# Patient Record
Sex: Female | Born: 1950 | Race: White | Hispanic: No | Marital: Married | Smoking: Never smoker
Health system: Southern US, Community
[De-identification: ages and names within clinical notes are randomized; demographics above are authoritative.]

## PROBLEM LIST (undated history)

## (undated) DIAGNOSIS — K219 Gastro-esophageal reflux disease without esophagitis: Secondary | ICD-10-CM

## (undated) DIAGNOSIS — Z860100 Personal history of colon polyps, unspecified: Secondary | ICD-10-CM

## (undated) DIAGNOSIS — H409 Unspecified glaucoma: Secondary | ICD-10-CM

## (undated) DIAGNOSIS — Z9889 Other specified postprocedural states: Secondary | ICD-10-CM

## (undated) DIAGNOSIS — T8859XA Other complications of anesthesia, initial encounter: Secondary | ICD-10-CM

## (undated) DIAGNOSIS — E079 Disorder of thyroid, unspecified: Secondary | ICD-10-CM

## (undated) DIAGNOSIS — T7840XA Allergy, unspecified, initial encounter: Secondary | ICD-10-CM

## (undated) DIAGNOSIS — K449 Diaphragmatic hernia without obstruction or gangrene: Secondary | ICD-10-CM

## (undated) DIAGNOSIS — Z5189 Encounter for other specified aftercare: Secondary | ICD-10-CM

## (undated) DIAGNOSIS — D509 Iron deficiency anemia, unspecified: Secondary | ICD-10-CM

## (undated) DIAGNOSIS — D649 Anemia, unspecified: Secondary | ICD-10-CM

## (undated) DIAGNOSIS — E0500 Thyrotoxicosis with diffuse goiter without thyrotoxic crisis or storm: Secondary | ICD-10-CM

## (undated) HISTORY — PX: COLONOSCOPY: SHX174

## (undated) HISTORY — DX: Allergy, unspecified, initial encounter: T78.40XA

## (undated) HISTORY — DX: Gastro-esophageal reflux disease without esophagitis: K21.9

## (undated) HISTORY — DX: Anemia, unspecified: D64.9

## (undated) HISTORY — DX: Encounter for other specified aftercare: Z51.89

## (undated) HISTORY — PX: THYROIDECTOMY: SHX17

## (undated) HISTORY — PX: BACK SURGERY: SHX140

## (undated) HISTORY — DX: Personal history of colon polyps, unspecified: Z86.0100

## (undated) HISTORY — DX: Iron deficiency anemia, unspecified: D50.9

## (undated) HISTORY — DX: Thyrotoxicosis with diffuse goiter without thyrotoxic crisis or storm: E05.00

## (undated) HISTORY — DX: Personal history of colonic polyps: Z86.010

## (undated) HISTORY — PX: ABDOMINAL HYSTERECTOMY: SHX81

## (undated) HISTORY — PX: BREAST EXCISIONAL BIOPSY: SUR124

## (undated) HISTORY — PX: POLYPECTOMY: SHX149

## (undated) HISTORY — DX: Diaphragmatic hernia without obstruction or gangrene: K44.9

## (undated) HISTORY — DX: Unspecified glaucoma: H40.9

---

## 1998-07-09 ENCOUNTER — Ambulatory Visit (HOSPITAL_COMMUNITY): Admission: RE | Admit: 1998-07-09 | Discharge: 1998-07-09 | Payer: Self-pay | Admitting: Endocrinology

## 2004-12-06 ENCOUNTER — Ambulatory Visit: Payer: Self-pay | Admitting: Gastroenterology

## 2005-01-17 ENCOUNTER — Ambulatory Visit: Payer: Self-pay | Admitting: Gastroenterology

## 2006-10-22 ENCOUNTER — Ambulatory Visit (HOSPITAL_COMMUNITY): Admission: RE | Admit: 2006-10-22 | Discharge: 2006-10-23 | Payer: Self-pay | Admitting: Neurosurgery

## 2008-12-29 HISTORY — PX: OTHER SURGICAL HISTORY: SHX169

## 2009-10-01 ENCOUNTER — Encounter: Admission: RE | Admit: 2009-10-01 | Discharge: 2009-10-01 | Payer: Self-pay | Admitting: Orthopedic Surgery

## 2009-12-31 ENCOUNTER — Encounter (INDEPENDENT_AMBULATORY_CARE_PROVIDER_SITE_OTHER): Payer: Self-pay | Admitting: *Deleted

## 2010-04-05 ENCOUNTER — Telehealth: Payer: Self-pay | Admitting: Gastroenterology

## 2010-04-25 ENCOUNTER — Telehealth: Payer: Self-pay | Admitting: Gastroenterology

## 2010-04-29 ENCOUNTER — Ambulatory Visit: Payer: Self-pay | Admitting: Internal Medicine

## 2010-04-29 ENCOUNTER — Encounter: Payer: Self-pay | Admitting: Physician Assistant

## 2010-04-29 DIAGNOSIS — R059 Cough, unspecified: Secondary | ICD-10-CM | POA: Insufficient documentation

## 2010-04-29 DIAGNOSIS — K5730 Diverticulosis of large intestine without perforation or abscess without bleeding: Secondary | ICD-10-CM | POA: Insufficient documentation

## 2010-04-29 DIAGNOSIS — R6881 Early satiety: Secondary | ICD-10-CM

## 2010-04-29 DIAGNOSIS — R05 Cough: Secondary | ICD-10-CM | POA: Insufficient documentation

## 2010-04-29 DIAGNOSIS — Z860100 Personal history of colon polyps, unspecified: Secondary | ICD-10-CM | POA: Insufficient documentation

## 2010-04-29 DIAGNOSIS — E669 Obesity, unspecified: Secondary | ICD-10-CM

## 2010-04-29 DIAGNOSIS — K219 Gastro-esophageal reflux disease without esophagitis: Secondary | ICD-10-CM

## 2010-05-01 ENCOUNTER — Ambulatory Visit: Payer: Self-pay | Admitting: Gastroenterology

## 2010-05-02 ENCOUNTER — Encounter: Payer: Self-pay | Admitting: Gastroenterology

## 2010-05-06 ENCOUNTER — Encounter: Payer: Self-pay | Admitting: Gastroenterology

## 2010-05-09 ENCOUNTER — Ambulatory Visit (HOSPITAL_COMMUNITY): Admission: RE | Admit: 2010-05-09 | Discharge: 2010-05-09 | Payer: Self-pay | Admitting: Internal Medicine

## 2010-06-20 ENCOUNTER — Ambulatory Visit: Payer: Self-pay | Admitting: Gastroenterology

## 2010-06-20 DIAGNOSIS — K3184 Gastroparesis: Secondary | ICD-10-CM

## 2010-08-09 ENCOUNTER — Encounter (INDEPENDENT_AMBULATORY_CARE_PROVIDER_SITE_OTHER): Payer: Self-pay | Admitting: *Deleted

## 2010-10-02 ENCOUNTER — Telehealth: Payer: Self-pay | Admitting: Gastroenterology

## 2010-10-03 ENCOUNTER — Encounter (INDEPENDENT_AMBULATORY_CARE_PROVIDER_SITE_OTHER): Payer: Self-pay | Admitting: *Deleted

## 2010-10-07 ENCOUNTER — Telehealth: Payer: Self-pay | Admitting: Gastroenterology

## 2010-10-10 ENCOUNTER — Encounter (INDEPENDENT_AMBULATORY_CARE_PROVIDER_SITE_OTHER): Payer: Self-pay | Admitting: *Deleted

## 2010-10-14 ENCOUNTER — Ambulatory Visit: Payer: Self-pay | Admitting: Gastroenterology

## 2010-10-21 ENCOUNTER — Telehealth: Payer: Self-pay | Admitting: Gastroenterology

## 2010-10-24 ENCOUNTER — Encounter: Admission: RE | Admit: 2010-10-24 | Discharge: 2010-10-24 | Payer: Self-pay | Admitting: Family Medicine

## 2010-11-01 ENCOUNTER — Ambulatory Visit: Payer: Self-pay | Admitting: Gastroenterology

## 2010-11-05 ENCOUNTER — Encounter: Payer: Self-pay | Admitting: Gastroenterology

## 2011-01-28 NOTE — Assessment & Plan Note (Signed)
Summary: follow up EGD/sheri   History of Present Illness Visit Type: Follow-up Visit Primary GI MD: Elie Goody MD [facility] Primary Provider: Selinda Flavin, MD Chief Complaint: GERD, gastroparesis.  Pt states she is doing well on Ranitidine.  She will need 90-day RX. History of Present Illness:   Shannon Figueroa returns for followup today of GERD and mild gastroparesis. Gastric emptying scan revealed 38% retention at 2 hours. EGD revealed distal esophageal erythema with biopsies consistent with GERD. Since adding ranitidine 300 mg h.s., bedblocks, dietary recommendations for gastroparesis and stricter antireflux measures, her symptoms have come under complete control.   GI Review of Systems      Denies abdominal pain, acid reflux, belching, bloating, chest pain, dysphagia with liquids, dysphagia with solids, heartburn, loss of appetite, nausea, vomiting, vomiting blood, weight loss, and  weight gain.        Denies anal fissure, black tarry stools, change in bowel habit, constipation, diarrhea, diverticulosis, fecal incontinence, heme positive stool, hemorrhoids, irritable bowel syndrome, jaundice, light color stool, liver problems, rectal bleeding, and  rectal pain.   Current Medications (verified): 1)  Aciphex 20 Mg Tbec (Rabeprazole Sodium) .... Take 1 Tablet By Mouth Once A Day 2)  Alprazolam 0.25 Mg Tabs (Alprazolam) .... Take 1 Tab By Mouth At Bedtime 3)  Synthroid 100 Mcg Tabs (Levothyroxine Sodium) .... Take 1 Tablet By Mouth Once A Day 4)  Flonase 50 Mcg/act Susp (Fluticasone Propionate) .Marland Kitchen.. 1 Spray Once Daily 5)  Ranitidine Hcl 300 Mg  Tabs (Ranitidine Hcl) .... One At Bedtime  Allergies (verified): 1)  ! Codeine 2)  ! Pcn  Past History:  Past Medical History: Reviewed history from 06/18/2010 and no changes required. 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 Back surgery Thyroidectomy Hysterectomy-partial BILATERAL OOPHORECTOMY 10/10-BENIGN MASS.  Family History: Reviewed history from 04/29/2010 and no changes required. No FH of Colon Cancer: Family History of Colon Polyps: Mother, Father Family History of Diabetes: Father Family History of Heart Disease: Father  Social History: Reviewed history from 04/29/2010 and no changes required. Patient has never smoked.  Alcohol Use - no Illicit Drug Use - no Patient does not get regular exercise.   Review of Systems       The pertinent positives and negatives are noted as above and in the HPI. All other ROS were reviewed and were negative.   Vital Signs:  Patient profile:   60 year old female Height:      70 inches Weight:      216 pounds BMI:     31.10 Pulse rate:   68 / minute Pulse rhythm:   regular BP sitting:   110 / 68  (left arm) Cuff size:   regular  Vitals Entered By: Francee Piccolo CMA Duncan Dull) (June 20, 2010 2:29 PM)  Physical Exam  General:  Well developed, well nourished, no acute distress. Head:  Normocephalic and atraumatic. Eyes:  PERRLA, no icterus. Mouth:  No deformity or lesions, dentition normal. Lungs:  Clear throughout to auscultation. Heart:  Regular rate and rhythm; no murmurs, rubs,  or bruits. Abdomen:  Soft, nontender and nondistended. No masses, hepatosplenomegaly or hernias noted. Normal bowel sounds. Psych:  Alert and cooperative. Normal mood and affect.  Impression & Recommendations:  Problem # 1:  GERD (ICD-530.81) Nonerosive esophagitis with mild gastroparesis. Continue standard dietary and lifestyle measures. Continue AcipHex 20 mg  q.a.m. and ranitidine 300 mg q.h.s.  Problem # 2:  GASTROPARESIS (ICD-536.3) Standard dietary measures and treatment for GERD as above.  Problem # 3:  PERSONAL HX COLONIC POLYPS (ICD-V12.72) History of adenomatous colon polyps in  January 2006. She is due for a five-year surveillance. Will plan to proceed within the next few months. Will allow some time for her UGI symptoms to be fully controlled before proceeding.  Patient Instructions: 1)  Please continue current medications.  2)  Your prescriptions have been sent into to your pharmacy. 3)  Please schedule a follow-up appointment in 1 year. 4)  Copy sent to : Selinda Flavin, MD 5)  The medication list was reviewed and reconciled.  All changed / newly prescribed medications were explained.  A complete medication list was provided to the patient / caregiver.  Prescriptions: RANITIDINE HCL 300 MG  TABS (RANITIDINE HCL) one at bedtime  #90 x 3   Entered by:   Christie Nottingham CMA (AAMA)   Authorized by:   Meryl Dare MD [facility]   Signed by:   Meryl Dare MD [facility] on 06/20/2010   Method used:   Electronically to        [facility] (retail)       [address]       [place], [place]  [address]       Ph: [phone]       Fax: [phone]   RxID:   [id number] ACIPHEX 20 MG TBEC (RABEPRAZOLE SODIUM) Take 1 tablet by mouth once a day  #90 x 3   Entered by:   Christie Nottingham CMA (AAMA)   Authorized by:   Meryl Dare MD [facility]   Signed by:   Meryl Dare MD [facility] on 06/20/2010   Method used:   Electronically to        [facility]* (retail)             ,          Ph: [phone]       Fax: [phone]   RxID:   [id number]   Appended Document: follow up EGD/sheri    Clinical Lists Changes  Observations: Added new observation of COLONNXTDUE: 08/29/2010 (06/20/2010 15:15)

## 2011-01-28 NOTE — Procedures (Signed)
Summary: Upper Endoscopy  Patient: [name] Note: All result statuses are Final unless otherwise noted.  Tests: (1) Upper Endoscopy (EGD)   EGD Upper Endoscopy       DONE     [facility]     [address]     [place], [place]  [address]           ENDOSCOPY PROCEDURE REPORT           PATIENT:  Shannon Figueroa, Shannon Figueroa  MR#:  [id number]     BIRTHDATE:  08/01/1951, 59 yrs. old  GENDER:  female           ENDOSCOPIST:  Judie Petit T. Russella Dar, MD, [facility]           PROCEDURE DATE:  05/01/2010     PROCEDURE:  EGD with biopsy     ASA CLASS:  Class II     INDICATIONS:  GERD           MEDICATIONS:  Fentanyl 50 mcg IV, Versed 5 mg IV     TOPICAL ANESTHETIC:  Exactacain Spray           DESCRIPTION OF PROCEDURE:   After the risks benefits and     alternatives of the procedure were thoroughly explained, informed     consent was obtained.  The LB GIF-H180 [id number] endoscope was     introduced through the mouth and advanced to the second portion of     the duodenum, without limitations.  The instrument was slowly     withdrawn as the mucosa was fully examined.     <<PROCEDUREIMAGES>>           Esophagitis was found in the distal esophagus. It was erythematous     and granular. It was 1 cm in length. R/O Barretts.  Multiple     biopsies were obtained and sent to pathology.  The stomach was     entered and closely examined. The pylorus, antrum, angularis, and     lesser curvature were well visualized, including a retroflexed     view of the cardia and fundus. The stomach wall was normally     distensable. The scope passed easily through the pylorus into the     duodenum.  The duodenal bulb was normal in appearance, as was the     postbulbar duodenum.  Otherwise the examination was normal.     Retroflexed views revealed a hiatal hernia, 6 cm. (32-38cm)  The     scope was then withdrawn from the patient and the procedure     completed.           COMPLICATIONS:  None           ENDOSCOPIC  IMPRESSION:     1) Esophagitis     2) Large hiatal hernia           RECOMMENDATIONS:     1) Anti-reflux regimen and 4" bed blocks     2) Await pathology results     3) OP follow-up in 6 weeks     4) Aciphex 20mg  po qam     5) ranitidine 300mg  hs, #30, 11 refills           Malcolm T. Russella Dar, MD, [facility]           CC:  Selinda Flavin, MD           n.     Rosalie DoctorVenita Lick. Stark at 05/01/2010 04:00 PM  [place], [place], [id number]  Note: An exclamation mark (!) indicates a result that was not dispersed into the flowsheet. Document Creation Date: 05/01/2010 4:01 PM _______________________________________________________________________  (1) Order result status: Final Collection or observation date-time: 05/01/2010 15:53 Requested date-time:  Receipt date-time:  Reported date-time:  Referring Physician:   Ordering Physician: Claudette Head [phone]) Specimen Source:  Source: Launa Grill Order Number: [id number] Lab site:

## 2011-01-28 NOTE — Progress Notes (Signed)
Summary: reflux  Phone Note Call from Patient Call back at [phone]   Caller: Patient Call For: Dr. Russella Dar Reason for Call: Talk to Nurse Summary of Call: Aciphex and Ranitidine are not helping anymore with reflux Initial call taken by: Vallarie Mare,  October 02, 2010 1:41 PM  Follow-up for Phone Call        Pt called to report increase in reflux for the last 6 weeks. Pt states she has "attacks at night that makes me get up." Pt is taking Aciphex daily. She has been taking the Ranitidine 150 mg at dinner and 300 mg at hs. Pt states this has not helped her. States she is abiding by anti-reflux diet and is using bed blocks. Dr. Russella Dar , please advise. Follow-up by: Jesse Fall RN,  October 02, 2010 2:38 PM  Additional Follow-up for Phone Call Additional follow up Details #1::        Change to Aciphex 20mg  by mouth two times a day (before breakfast and dinner) and ranitidine 300mg  hs. Gastroparesis and GERD diets. Additional Follow-up by: Meryl Dare MD [facility],  October 02, 2010 3:28 PM    Additional Follow-up for Phone Call Additional follow up Details #2::    Patient  advised of Dr. Ardell Isaacs recommendation. New rx to pharmacy. Follow-up by: Darcey Nora RN, CGRN,  October 03, 2010 10:42 AM  New/Updated Medications: ACIPHEX 20 MG TBEC (RABEPRAZOLE SODIUM) Take 1 tablet before breakfast and 1 tablet before dinner Prescriptions: ACIPHEX 20 MG TBEC (RABEPRAZOLE SODIUM) Take 1 tablet before breakfast and 1 tablet before dinner  #60 x 11   Entered by:   Darcey Nora RN, CGRN   Authorized by:   Meryl Dare MD [facility]   Signed by:   Darcey Nora RN, CGRN on 10/03/2010   Method used:   [facility]ron[facility]        [facility] [address] [address] [id number] (retail[facility]       [address]       [place], [place]  [address]       Ph: [phone] or [phone]       Fax: [phone]   RxID:   [id number]

## 2011-01-28 NOTE — Progress Notes (Signed)
Summary: ?'s re Dexilant  Phone Note Call from Patient Call back at [phone]   Caller: Patient Call For: Dr. Russella Dar Reason for Call: Talk to Nurse Summary of Call: questions about Dexilant Initial call taken by: Vallarie Mare,  October 07, 2010 1:35 PM  Follow-up for Phone Call        Pt states her PCP already put her on Dexilant and that gave her diarrhea. Pt states she has tried Aciphex, Dexilant, Prilosec, and Nexium which all gave her some form of side effect that she could not tolerate. Pt would like to try something else. Told pt we will switch her to Protonix. Is that ok? Follow-up by: Christie Nottingham CMA Duncan Dull),  October 07, 2010 2:15 PM  Additional Follow-up for Phone Call Additional follow up Details #1::        Protonix 40mg  by mouth two times a day with refills for one year. Additional Follow-up by: Meryl Dare MD [facility],  October 07, 2010 2:35 PM   New Allergies: ! NEXIUM ! DEXILANT ! * ACIPHEX Additional Follow-up for Phone Call Additional follow up Details #2::    Rx was sent to pts pharmacy.  Follow-up by: Christie Nottingham CMA Duncan Dull),  October 07, 2010 2:40 PM  New/Updated Medications: PANTOPRAZOLE SODIUM 40 MG TBEC (PANTOPRAZOLE SODIUM) one tablet by mouth two times a day New Allergies: ! NEXIUM ! DEXILANT ! * ACIPHEXPrescriptions: PANTOPRAZOLE SODIUM 40 MG TBEC (PANTOPRAZOLE SODIUM) one tablet by mouth two times a day  #60 x 11   Entered by:   Christie Nottingham CMA (AAMA)   Authorized by:   Meryl Dare MD [facility]   Signed by:   Christie Nottingham CMA (AAMA) on 10/07/2010   Method used:   [facility]        [facility]  [address] [id number]       [address]       [place], [place]  [address]       Ph: [phone] or [phone]       Fax: [phone]   RxID:   [id number]

## 2011-01-28 NOTE — Letter (Signed)
Summary: Appt Reminder 2  [facility]  [address] [place], [place] [address]   Phone: [phone]  Fax: [phone]        May 02, 2010 MRN: [id number]    [facility] [address] [place], [place]  [address]    Dear Ms. Rizzolo,   You have a return appointment with Dr. Russella Dar on 06-20-10 at 2:30 pm.  I have canceled the appointment that you had scheduled with Dr Russella Dar on 05-20-10.  Please remember to bring a complete list of the medicines you are taking, your insurance card and your co-pay.  If you have to cancel or reschedule this appointment, please call before 5:00 pm the evening before to avoid a cancellation fee.  If you have any questions or concerns, please call [phone].    Sincerely,    Darcey Nora RN, CGRN  Appended Document: Appt Reminder 2 letter mailed to patient's home

## 2011-01-28 NOTE — Letter (Signed)
Summary: Patient [facility] Biopsy Results  [facility]  [address] [place], [place] [address]   Phone: [phone]  Fax: [phone]        May 06, 2010 MRN: [id number]    [facility] [address] [place], [place]  [address]    Dear Ms. Humann,  I am pleased to inform you that the biopsies taken during your recent endoscopic examination did not show any evidence of cancer upon pathologic examination. The biopsies showed changes of acid reflux.  Continue with the treatment plan as outlined on the day of your      exam.  Please call us if you are having persistent problems or have questions about your condition that have not been fully answered at this time.  Sincerely,  Meryl Dare MD [facility]  This letter has been electronically signed by your physician.  Appended Document: Patient Notice-Endo Biopsy Results letter mailed

## 2011-01-28 NOTE — Miscellaneous (Signed)
Summary: [facility]  Clinical Lists Changes  Medications: Added new medication of MOVIPREP 100 GM  SOLR (PEG-KCL-NACL-NASULF-NA ASC-C) As per prep instructions. - Signed Rx of MOVIPREP 100 GM  SOLR (PEG-KCL-NACL-NASULF-NA ASC-C) As per prep instructions.;  #1 x 0;  Signed;  Entered by: Ezra Sites RN;  Authorized by: Meryl Dare MD [facility];  Method used: [facility]  [address] [facility] [address], [place], [place], [place]  [address], Ph: [phone] or [phone], Fax: [phone] Allergies: Changed allergy or adverse reaction from NEXIUM to NEXIUM    Prescriptions: MOVIPREP 100 GM  SOLR (PEG-KCL-NACL-NASULF-NA ASC-C) As per prep instructions.  #1 x 0   Entered by:   Ezra Sites RN   Authorized by:   Meryl Dare MD [facility]   Signed by:   Ezra Sites RN on 10/14/2010   Method used:   [facility]        [facility]  [address] [facility]       [address]       [place], [place]  [address]       Ph: [phone] or [phone]       Fax: [phone]   RxID:   [id number]

## 2011-01-28 NOTE — Progress Notes (Signed)
Summary: rx change  Phone Note Call from Patient Call back at [phone]   Caller: Patient Call For: Dr. Russella Dar Reason for Call: Talk to Nurse Summary of Call: needs to change Pantoprazole rx to mail order, [facility] 90 day supply Initial call taken by: Vallarie Mare,  October 21, 2010 9:37 AM  Follow-up for Phone Call        Prescription has been sent to [facility] and pt notified. Follow-up by: Christie Nottingham CMA Duncan Dull),  October 21, 2010 9:39 AM    Prescriptions: PANTOPRAZOLE SODIUM 40 MG TBEC (PANTOPRAZOLE SODIUM) one tablet by mouth two times a day  #180 x 1   Entered by:   Christie Nottingham CMA (AAMA)   Authorized by:   Meryl Dare MD [facility]   Signed by:   Christie Nottingham CMA (AAMA) on 10/21/2010   Method used:   Electronically to        [facility]* (retail)             ,          Ph: [phone]       Fax: [phone]   RxID:   [id number]

## 2011-01-28 NOTE — Assessment & Plan Note (Signed)
Summary: GERD, vomiting/cough/sheri   History of Present Illness Visit Type: Initial Visit Primary GI MD: Elie Goody MD [facility] Primary Provider: Selinda Flavin, MD Chief Complaint: Patient c/o several months worsening problems with her reflux. She c/o midsternal chest burning as well as early satiety. She c/o feeling like something is stuck lower down in her throat, but denies dysphagia or odynophagia. Patient also c/o vomiting. She has occasional diarrhea as well but denies any abdominal pain associated with that. History of Present Illness:   60 Y.O FEMALE KNOWN TO DR. Russella Dar FROM COLONOSCOPY DONE IN 1/06 SHE HAD DIVERTICULOSIS AND AN ADENOMATOUS POLYP. SHE IS DUE FOR FOLLOW UP.SHE COMES IN TODAY WITH C/O POORLY CONTROLLED REFLUX SXS. SHE HAS BEEN ON MEDICATION FOR THE PAST 12 YEARS BUT HAS NOT HAD AN EGD. SHE HAS DIFFICULTY WITH PPI'S AS SEVERAL HAVE SIDE EFFECT OF DIARRHEA. SHE IS CURRENTLY ON ACIPHEX 20 MG, WHICH HELPS BUT DOES NOT CONTROL HER SXS. SHE CANNOT TOLERATE 2 DAILY DUE TO DIARRHEA. SHE HAS DAYTIME SXS,AND BAD NIGHTIME SXS-OFTEN SLEEPING IN A RECLINER. SHE WILL WAKE UP REFLUXING WITH BITTER FLUID IN HER MOUTH AND SOMETIMES FEELS LIKE SHE IS ASPIRATING.NO DYSPHAGIA OR ODYNOPHAGIA. SHE ALSO C/O EARLY SATIETY-FEELING VERY FULL AND UNCOMFORTABLE HOURSAFTER SHE HAS EATEN.NO SIGNIFICANT WEIGHT LOSS. PERSISTENT COUGH.   GI Review of Systems    Reports acid reflux, belching, and  bloating.      Denies abdominal pain, chest pain, dysphagia with liquids, dysphagia with solids, heartburn, loss of appetite, nausea, vomiting, vomiting blood, and  weight loss.        Denies anal fissure, black tarry stools, change in bowel habit, constipation, diarrhea, diverticulosis, fecal incontinence, heme positive stool, hemorrhoids, irritable bowel syndrome, jaundice, light color stool, liver problems, rectal bleeding, and  rectal pain. Preventive Screening-Counseling & Management  Alcohol-Tobacco  Smoking Status: never  Caffeine-Diet-Exercise     Does Patient Exercise: no      Drug Use:  no.      Current Medications (verified): 1)  Aciphex 20 Mg Tbec (Rabeprazole Sodium) .... Take 1 Tablet By Mouth Once A Day 2)  Alprazolam 0.25 Mg Tabs (Alprazolam) .... Take 1 Tab By Mouth At Bedtime 3)  Synthroid 100 Mcg Tabs (Levothyroxine Sodium) .... Take 1 Tablet By Mouth Once A Day 4)  Flonase 50 Mcg/act Susp (Fluticasone Propionate) .Marland Kitchen.. 1 Spray Once Daily  Allergies (verified): 1)  ! Codeine 2)  ! Pcn  Past History:  Past Medical History: GERD Anxiety Hx Graves Disease OSTEOARTHRITIS  Past Surgical History: Back surgery Thyroidectomy Hysterectomy-partial BILATERAL OOPHORECTOMY 10/10-BENIGN MASS.  Family History: No FH of Colon Cancer: Family History of Colon Polyps: Mother, Father Family History of Diabetes: Father Family History of Heart Disease: Father  Social History: Patient has never smoked.  Alcohol Use - no Illicit Drug Use - no Patient does not get regular exercise.  Smoking Status:  never Drug Use:  no Does Patient Exercise:  no  Review of Systems  The patient denies allergy/sinus, anemia, anxiety-new, arthritis/joint pain, back pain, blood in urine, breast changes/lumps, change in vision, confusion, cough, coughing up blood, depression-new, fainting, fatigue, fever, headaches-new, hearing problems, heart murmur, heart rhythm changes, itching, menstrual pain, muscle pains/cramps, night sweats, nosebleeds, pregnancy symptoms, shortness of breath, skin rash, sleeping problems, sore throat, swelling of feet/legs, swollen lymph glands, thirst - excessive , urination - excessive , urination changes/pain, urine leakage, vision changes, and voice change.         ROS OTHERWISE AS IN  HPI  Vital Signs:  Patient profile:   60 year old female Height:      70 inches Weight:      212.13 pounds BMI:     30.55 BSA:     2.14 Pulse rate:   80 / minute Pulse rhythm:    regular BP sitting:   130 / 74  (right arm)  Vitals Entered By: Lamona Curl CMA Duncan Dull) (Apr 29, 2010 9:12 AM)  Physical Exam  General:  Well developed, well nourished, no acute distress. Head:  Normocephalic and atraumatic. Eyes:  PERRLA, no icterus. Lungs:  Clear throughout to auscultation. Heart:  Regular rate and rhythm; no murmurs, rubs,  or bruits. Abdomen:  SOFT, NONTENDER, NO MASS OR HSM,BS+ Rectal:  NOT DONE Neurologic:  Alert and  oriented x4;  grossly normal neurologically. Psych:  Alert and cooperative. Normal mood and affect.   Impression & Recommendations:  Problem # 1:  GERD (ICD-530.81) Assessment Deteriorated 59 Y.O FEMALE WITH POORLY CONTROLLED ACID REFLUX-WITH DAY AND NIGHTTIME SXS,SOUR BRASH ? ASPIRATION.ALSO WITH EARLY SATIETY -R/O UNDERLYING GASTROPARESIS,  CONTINUE ACIPHEX 20 MG DAILY AT DINNER ADD PRILOSEC 20 MG DAILY IN AM BEFORE BREAKFAST STRICT ANTIREFUX REGIMEN REVIEWED INCLUDING ELEVATION OF HEAD OF BED. SCHEDULE FOR EGD WITH DR. Jalene Mullet DISCUSSED IN DETAIL WITH PT. SCHEDULE FOR GASTRIC EMPTYING SCAN.  Orders: EGD (EGD)  Problem # 2:  PERSONAL HX COLONIC POLYPS (ICD-V12.72) Assessment: Comment Only ADENOMATOUS- LAST COLON 1/06. SHE IS DUE FOR FOLLOW UP . WILL PURSUE ONCE HER REFLUX ISSUES BETTER CONTROLLED.  Problem # 3:  OBESITY (ICD-278.00) Assessment: Comment Only  Other Orders: Gastric Emptying Scan (GES)  Patient Instructions: 1)  Please come for your scheduled endoscopy on 05/01/10 at [facility] 4th Floor. You will need to arrive at 2:30 pm for your 3:30 pm procedure. 2)  Please continue taking your Aciphex (before dinner) every day. 3)  Add prilosec 20 mg daily (before breakfast). You have been given samples of this. 4)  You have been scheduled for a gastric emptying scan on 05/09/10 at 9:00 am. You will need to arrive at 8:45 am for registration at [facility].  5)  The medication list was reviewed and  reconciled.  All changed / newly prescribed medications were explained.  A complete medication list was provided to the patient / caregiver.

## 2011-01-28 NOTE — Progress Notes (Signed)
Summary: Triage  Phone Note Call from Patient Call back at Work Phone [phone]   Caller: Patient Call For: Dr. Russella Dar Reason for Call: Talk to Nurse Summary of Call: Cannot tolerate the increased dose of Aciphex....is causing a severe headache Initial call taken by: Karna Christmas,  October 07, 2010 9:59 AM  Follow-up for Phone Call        Patient increased her PPI to two times a day and it helped with her reflux, but gives her HA to take it two times a day.  She is wondering if there is an alternate drug she can try?  Dr Russella Dar Please advise Follow-up by: Darcey Nora RN, CGRN,  October 07, 2010 10:11 AM  Additional Follow-up for Phone Call Additional follow up Details #1::        Trail of Dexilant 39m by mouth qam in place of Aciphex Additional Follow-up by: Meryl Dare MD [facility],  October 07, 2010 10:28 AM    Additional Follow-up for Phone Call Additional follow up Details #2::    patient aware. Follow-up by: Darcey Nora RN, CGRN,  October 07, 2010 10:40 AM  New/Updated Medications: DEXILANT 60 MG CPDR (DEXLANSOPRAZOLE) 1 by mouth q am Prescriptions: DEXILANT 60 MG CPDR (DEXLANSOPRAZOLE) 1 by mouth q am  #30 x 11   Entered by:   Darcey Nora RN, CGRN   Authorized by:   Meryl Dare MD [facility]   Signed by:   Darcey Nora RN, CGRN on 10/07/2010   Method used:   [facility]  [address] [address] [facility]       [address]       [place], [place]  [address]       Ph: [phone] or [phone]       Fax: [phone]   RxID:   [id number]

## 2011-01-28 NOTE — Letter (Signed)
Summary: EGD Instructions  [facility]  [address] [place], [place] [address]   Phone: [phone]  Fax: [phone]       Shannon Figueroa    04-Dec-1951    MRN: [id number]       Procedure Day /Date: 5/4 11      Arrival Time: 2:30 pm     Procedure Time: 3:30 pm     Location of Procedure:                    _ x_ [facility] (4th Floor)  PREPARATION FOR ENDOSCOPY   On 5/4 THE DAY OF THE PROCEDURE:  1.   No solid foods, milk or milk products are allowed after midnight the night before your procedure.  2.   Do not drink anything colored red or purple.  Avoid juices with pulp.  No orange juice.  3.  You may drink clear liquids until 1:30 pm, which is 2 hours before your procedure.                                                                                                CLEAR LIQUIDS INCLUDE: Water Jello Ice Popsicles Tea (sugar ok, no milk/cream) Powdered fruit flavored drinks Coffee (sugar ok, no milk/cream) Gatorade Juice: apple, white grape, white cranberry  Lemonade Clear bullion, consomm, broth Carbonated beverages (any kind) Strained chicken noodle soup Hard Candy   MEDICATION INSTRUCTIONS  Unless otherwise instructed, you should take regular prescription medications with a small sip of water as early as possible the morning of your procedure.                 OTHER INSTRUCTIONS  You will need a responsible adult at least 58 years of age to accompany you and drive you home.   This person must remain in the waiting room during your procedure.  Wear loose fitting clothing that is easily removed.  Leave jewelry and other valuables at home.  However, you may wish to bring a book to read or an iPod/MP3 player to listen to music as you wait for your procedure to start.  Remove all body piercing jewelry and leave at home.  Total time from sign-in until discharge is approximately 2-3 hours.  You should go home directly  after your procedure and rest.  You can resume normal activities the day after your procedure.  The day of your procedure you should not:   Drive   Make legal decisions   Operate machinery   Drink alcohol   Return to work  You will receive specific instructions about eating, activities and medications before you leave.    The above instructions have been reviewed and explained to me by   Lamona Curl CMA Duncan Dull)  Apr 29, 2010 10:42 AM     I fully understand and can verbalize these instructions _____________________________ Date 04/28/10

## 2011-01-28 NOTE — Letter (Signed)
Summary: Colonoscopy Letter  [facility]  [address] [place], [place] [address]   Phone: [phone]  Fax: [phone]      August 09, 2010 MRN: [id number]   [facility] [address] [place], [place]  [address]   Dear Ms. Bechtol,   According to your medical record, it is time for you to schedule a Colonoscopy. The American Cancer Society recommends this procedure as a method to detect early colon cancer. Patients with a family history of colon cancer, or a personal history of colon polyps or inflammatory bowel disease are at increased risk.  This letter has beeen generated based on the recommendations made at the time of your procedure. If you feel that in your particular situation this may no longer apply, please contact our office.  Please call our office at [phone] to schedule this appointment or to update your records at your earliest convenience.  Thank you for cooperating with [place] to provide you with the very best care possible.   Sincerely,  Judie Petit T. Russella Dar, M.D.  [facility] Gastroenterology Division [phone]

## 2011-01-28 NOTE — Letter (Signed)
Summary: Patient Notice- Polyp Results  [facility]  [address] [place], [place] [address]   Phone: [phone]  Fax: [phone]        November 05, 2010 MRN: [id number]    [facility] [address] [place], [place]  [address]    Dear Ms. Lincoln,  I am pleased to inform you that the colon polyp(s) removed during your recent colonoscopy was (were) found to be benign (no cancer detected) upon pathologic examination.  I recommend you have a repeat colonoscopy examination in 5 years to look for recurrent polyps, as having colon polyps increases your risk for having recurrent polyps or even colon cancer in the future.  Should you develop new or worsening symptoms of abdominal pain, bowel habit changes or bleeding from the rectum or bowels, please schedule an evaluation with either your primary care physician or with me.  Continue treatment plan as outlined the day of your exam.  Please call us if you are having persistent problems or have questions about your condition that have not been fully answered at this time.  Sincerely,  Meryl Dare MD [facility]  This letter has been electronically signed by your physician.  Appended Document: Patient Notice- Polyp Results letter mailed

## 2011-01-28 NOTE — Letter (Signed)
Summary: Colonoscopy Letter  [facility]  [address] [place], [place] [address]   Phone: [phone]  Fax: [phone]      December 31, 2009 MRN: [id number]   [facility] [address] [place], [place]  [address]   Dear Ms. Graybill,   According to your medical record, it is time for you to schedule a Colonoscopy. The American Cancer Society recommends this procedure as a method to detect early colon cancer. Patients with a family history of colon cancer, or a personal history of colon polyps or inflammatory bowel disease are at increased risk.  This letter has beeen generated based on the recommendations made at the time of your procedure. If you feel that in your particular situation this may no longer apply, please contact our office.  Please call our office at [phone] to schedule this appointment or to update your records at your earliest convenience.  Thank you for cooperating with [place] to provide you with the very best care possible.   Sincerely,  Judie Petit T. Russella Dar, M.D.  [facility] Gastroenterology Division [phone]

## 2011-01-28 NOTE — Procedures (Signed)
Summary: Colonoscopy  Patient: [name] Note: All result statuses are Final unless otherwise noted.  Tests: (1) Colonoscopy (COL)   COL Colonoscopy           DONE     [facility]     [address]     [place], [place]  [address]           COLONOSCOPY PROCEDURE REPORT     PATIENT:  Shannon Figueroa, Shannon Figueroa  MR#:  [id number]     BIRTHDATE:  1951-09-01, 59 yrs. old  GENDER:  female     ENDOSCOPIST:  Judie Petit T. Russella Dar, MD, Clementeen Graham           [facility] DATE:  11/01/2010     PROCEDURE:  Colonoscopy with biopsy     ASA CLASS:  Class II     INDICATIONS:  1) surveillance and high-risk screening  2) history     of adenomatous colon polyps: 12/2004.     MEDICATIONS:   Fentanyl 75 mcg IV, Versed 8 mg IV     DESCRIPTION OF PROCEDURE:   After the risks benefits and     alternatives of the procedure were thoroughly explained, informed     consent was obtained.  Digital rectal exam was performed and     revealed no abnormalities.   The LB [id number]-[id number] endoscope     was introduced through the anus and advanced to the cecum, which     was identified by both the appendix and ileocecal valve, without     limitations.  The quality of the prep was excellent, using     MoviPrep.  The instrument was then slowly withdrawn as the colon     was fully examined.     <<PROCEDUREIMAGES>>     FINDINGS:  Two sessile polyps were found in the ascending colon.     They were 3-4 mm. The polyps were removed using cold biopy     forceps. A sessile polyp was found in the descending colon. It was     4 mm in size. The polyp was removed using cold biopsy forceps.     Mild diverticulosis was found in the sigmoid colon. A normal     appearing cecum, ileocecal valve, and appendiceal orifice were     identified. The hepatic flexure, transverse, splenic flexure, and     rectum appeared unremarkable. Retroflexed views in the rectum     revealed no abnormalities.  The time to cecum =  3.33  minutes.     The scope was then  withdrawn (time =  9.67  min) from the patient     and the procedure completed.           COMPLICATIONS:  None           ENDOSCOPIC IMPRESSION:     1) 4 mm sessile polyp in the descending colon     2) 3-48mm, two polyps in ascending colon     2) Mild diverticulosis in the sigmoid colon           RECOMMENDATIONS:     1) Await pathology results     2) High fiber diet with liberal fluid intake.     3) Repeat Colonoscopy in 5 years pending pathology review.           Venita Lick. Russella Dar, MD, Clementeen Graham           CC: Selinda Flavin, MD  n.     eSIGNED:   Malcolm T. Stark at 11/01/2010 02:29 PM           Catarina Hartshorn, [id number]  Note: An exclamation mark (!) indicates a result that was not dispersed into the flowsheet. Document Creation Date: 11/01/2010 2:29 PM _______________________________________________________________________  (1) Order result status: Final Collection or observation date-time: 11/01/2010 14:18 Requested date-time:  Receipt date-time:  Reported date-time:  Referring Physician:   Ordering Physician: Claudette Head [phone]) Specimen Source:  Source: Launa Grill Order Number: [id number] Lab site:   Appended Document: Colonoscopy     Procedures Next Due Date:    Colonoscopy: 10/2015

## 2011-01-28 NOTE — Letter (Signed)
Summary: Pre Visit Letter Revised  [facility]  [address] [place], [place] [address]   Phone: [phone]  Fax: [phone]        10/03/2010 MRN: [id number] [facility] [address] [place], [place]  [address]             Procedure Date:  11/01/2010   Welcome to the Gastroenterology Division at [facility].    You are scheduled to see a nurse for your pre-procedure visit on 10/14/2010 at 4:30PM on the 3rd floor at [facility], [address]  We ask that you try to arrive at our office 15 minutes prior to your appointment time to allow for check-in.  Please take a minute to review the attached form.  If you answer "Yes" to one or more of the questions on the first page, we ask that you call the person listed at your earliest opportunity.  If you answer "No" to all of the questions, please complete the rest of the form and bring it to your appointment.    Your nurse visit will consist of discussing your medical and surgical history, your immediate family medical history, and your medications.   If you are unable to list all of your medications on the form, please bring the medication bottles to your appointment and we will list them.  We will need to be aware of both prescribed and over the counter drugs.  We will need to know exact dosage information as well.    Please be prepared to read and sign documents such as consent forms, a financial agreement, and acknowledgement forms.  If necessary, and with your consent, a friend or relative is welcome to sit-in on the nurse visit with you.  Please bring your insurance card so that we may make a copy of it.  If your insurance requires a referral to see a specialist, please bring your referral form from your primary care physician.  No co-pay is required for this nurse visit.     If you cannot keep your appointment, please call [phone] to cancel or reschedule prior to your appointment date.  This  allows [place] the opportunity to schedule an appointment for another patient in need of care.    Thank you for choosing [facility] for your medical needs.  We appreciate the opportunity to care for you.  Please visit [place] at our website  to learn more about our practice.  Sincerely, The Gastroenterology Division

## 2011-01-28 NOTE — Procedures (Signed)
Summary: Colonoscopy    Colonoscopy  Procedure date:  01/17/2005  Findings:      Results: Diverticulosis.       Pathology:  Adenomatous polyp.        Location:  [facility].    Procedures Next Due Date:    Colonoscopy: 01/2010 Patient Name: Shannon Figueroa, Shannon Figueroa. MRN:  Procedure Procedures: Colonoscopy CPT: 9126895794.    with polypectomy. CPT: A3573898.  Personnel: Endoscopist: Venita Lick. Russella Dar, MD, Clementeen Graham.  Referred By: Weyman Pedro, MD.  Exam Location: Exam performed in Outpatient Clinic. Outpatient  Patient Consent: Procedure, Alternatives, Risks and Benefits discussed, consent obtained, from patient. Consent was obtained by the RN.  Indications  Increased Risk Screening: For family history of colorectal neoplasia, in  grandparent  History  Current Medications: Patient is not currently taking Coumadin.  Pre-Exam Physical: Performed Jan 17, 2005. Entire physical exam was normal.  Exam Exam: Extent of exam reached: Cecum, extent intended: Cecum.  The cecum was identified by appendiceal orifice and IC valve. Colon retroflexion performed. ASA Classification: II. Tolerance: excellent.  Monitoring: Pulse and BP monitoring, Oximetry used. Supplemental O2 given.  Colon Prep Used Miralax for colon prep. Prep results: good.  Sedation Meds: Patient assessed and found to be appropriate for moderate (conscious) sedation. Fentanyl 75 mcg. given IV. Versed 8 mg. given IV.  Findings POLYP: Transverse Colon, Maximum size: 4 mm. sessile polyp. Procedure:  snare without cautery, removed, retrieved, Polyp sent to pathology. ICD9: Colon Polyps: 211.3.  NORMAL EXAM: Splenic Flexure to Descending Colon.  NORMAL EXAM: Cecum to Hepatic Flexure.  - DIVERTICULOSIS: Sigmoid Colon. Not bleeding. ICD9: Diverticulosis: 562.10.  NORMAL EXAM: Rectum.   Assessment  Diagnoses: 211.3: Colon Polyps.  562.10: Diverticulosis.   Events  Unplanned Interventions: No intervention was  required.  Unplanned Events: There were no complications. Plans  Post Exam Instructions: No aspirin or non-steroidal containing medications: 2 weeks.  Medication Plan: Await pathology. Continue current medications.  Patient Education: Patient given standard instructions for: Polyps. Hemorrhoids.  Disposition: After procedure patient sent to recovery. After recovery patient sent home.  Scheduling/Referral: Colonoscopy, to [facility] T. Russella Dar, MD, [facility], around Jan 17, 2010.    This report was created from the original endoscopy report, which was reviewed and signed by the above listed endoscopist.    cc: Weyman Pedro, MD

## 2011-01-28 NOTE — Progress Notes (Signed)
Summary: triage  Phone Note Call from Patient Call back at cell-- [phone]   Caller: Patient Call For: Shannon Figueroa Reason for Call: Talk to Nurse Summary of Call: Patient states that she has severe acid reflux and can't wait until May for an appt, wants to see anyone sooner or what can she do because rx are not helping. Initial call taken by: Tawni Levy,  April 05, 2010 8:52 AM  Follow-up for Phone Call        Patient  with a hx of reflux and she has been on Aciphex that is not helping his symptoms.  I have scheduled her for an appointment with Dr Russella Dar 05/20/10 .  Patient  instructed to maintain anti-reflux diet. Advised to avoid caffeine, mint, citrus foods/juices, tomatoes, chocolate. Instructed not to eat within 2 hours of exercise or bed, small meals are better than 3 large. Need to take PPI 30 minutes prior to 1st meal of the day. Follow-up by: Darcey Nora RN, CGRN,  April 05, 2010 9:25 AM

## 2011-01-28 NOTE — Letter (Signed)
Summary: [facility] Instructions  [facility]  [address] [place], [place] [address]   Phone: [phone]  Fax: [phone]       Shannon Figueroa    August 21, 1951    MRN: [id number]        Procedure Day /Date:  Friday 11/01/2010     Arrival Time: 1:00 pm      Procedure Time: 2:00 pm     Location of Procedure:                    _x _  [facility] (4th Floor)                        PREPARATION FOR COLONOSCOPY WITH MOVIPREP   Starting 5 days prior to your procedure Sunday 10/30 do not eat nuts, seeds, popcorn, corn, beans, peas,  salads, or any raw vegetables.  Do not take any fiber supplements (e.g. Metamucil, Citrucel, and Benefiber).  THE DAY BEFORE YOUR PROCEDURE         DATE: Thursday 11/3  1.  Drink clear liquids the entire day-NO SOLID FOOD  2.  Do not drink anything colored red or purple.  Avoid juices with pulp.  No orange juice.  3.  Drink at least 64 oz. (8 glasses) of fluid/clear liquids during the day to prevent dehydration and help the prep work efficiently.  CLEAR LIQUIDS INCLUDE: Water Jello Ice Popsicles Tea (sugar ok, no milk/cream) Powdered fruit flavored drinks Coffee (sugar ok, no milk/cream) Gatorade Juice: apple, white grape, white cranberry  Lemonade Clear bullion, consomm, broth Carbonated beverages (any kind) Strained chicken noodle soup Hard Candy                             4.  In the morning, mix first dose of MoviPrep solution:    Empty 1 Pouch A and 1 Pouch B into the disposable container    Add lukewarm drinking water to the top line of the container. Mix to dissolve    Refrigerate (mixed solution should be used within 24 hrs)  5.  Begin drinking the prep at 5:00 p.m. The MoviPrep container is divided by 4 marks.   Every 15 minutes drink the solution down to the next mark (approximately 8 oz) until the full liter is complete.   6.  Follow completed prep with 16 oz of clear liquid of your choice (Nothing red  or purple).  Continue to drink clear liquids until bedtime.  7.  Before going to bed, mix second dose of MoviPrep solution:    Empty 1 Pouch A and 1 Pouch B into the disposable container    Add lukewarm drinking water to the top line of the container. Mix to dissolve    Refrigerate  THE DAY OF YOUR PROCEDURE      DATE: Friday 11/4  Beginning at 9:00 a.m. (5 hours before procedure):         1. Every 15 minutes, drink the solution down to the next mark (approx 8 oz) until the full liter is complete.  2. Follow completed prep with 16 oz. of clear liquid of your choice.    3. You may drink clear liquids until 12:00  pm(2 HOURS BEFORE PROCEDURE).   MEDICATION INSTRUCTIONS  Unless otherwise instructed, you should take regular prescription medications with a small sip of water   as early as possible the morning of  your procedure.           OTHER INSTRUCTIONS  You will need a responsible adult at least 58 years of age to accompany you and drive you home.   This person must remain in the waiting room during your procedure.  Wear loose fitting clothing that is easily removed.  Leave jewelry and other valuables at home.  However, you may wish to bring a book to read or  an iPod/MP3 player to listen to music as you wait for your procedure to start.  Remove all body piercing jewelry and leave at home.  Total time from sign-in until discharge is approximately 2-3 hours.  You should go home directly after your procedure and rest.  You can resume normal activities the  day after your procedure.  The day of your procedure you should not:   Drive   Make legal decisions   Operate machinery   Drink alcohol   Return to work  You will receive specific instructions about eating, activities and medications before you leave.    The above instructions have been reviewed and explained to me by   Ezra Sites RN  October 14, 2010 4:52 PM    I fully understand and can verbalize  these instructions _____________________________ Date _________

## 2011-01-28 NOTE — Progress Notes (Signed)
Summary: Triage  Phone Note Call from Patient Call back at Work Phone [phone] Call back at [phone]   Caller: Patient Call For: Dr. Russella Dar Reason for Call: Talk to Nurse Summary of Call: Pt has an appt. on 05-20-10 and daughter is requesting sooner appt. Pt is having bad acid reflux and vomiting. She said she feels like she cannot wait until May to be seen Initial call taken by: Karna Christmas,  April 25, 2010 10:23 AM  Follow-up for Phone Call        Patient c/o continued GERD, vomiting and constant cough.  She says her symptoms are worsening.  I will call her back with an appointment for the extender for next week once a schedule has been opened.  Patient  aware I will call her later today or tomorrow. Follow-up by: Darcey Nora RN, CGRN,  April 25, 2010 10:42 AM  Additional Follow-up for Phone Call Additional follow up Details #1::        Patient  scheduled for Np3 with Mike Gip PA for 04/29/10 9:30 Additional Follow-up by: Darcey Nora RN, CGRN,  April 25, 2010 2:27 PM

## 2011-01-28 NOTE — Miscellaneous (Signed)
Summary: ranitidine prescription  Clinical Lists Changes  Medications: Added new medication of RANITIDINE HCL 300 MG  TABS (RANITIDINE HCL) one at bedtime - Signed Rx of RANITIDINE HCL 300 MG  TABS (RANITIDINE HCL) one at bedtime;  #30 x 11;  Signed;  Entered by: Laverna Peace RN;  Authorized by: Meryl Dare MD [facility];  Method used: Electronically to [facility]  [id number] [place], [place], [place]  [address], Ph: [phone] or [phone], Fax: [phone]    Prescriptions: RANITIDINE HCL 300 MG  TABS (RANITIDINE HCL) one at bedtime  #30 x 11   Entered by:   Laverna Peace RN   Authorized by:   Meryl Dare MD [facility]   Signed by:   Laverna Peace RN on 05/01/2010   Method used:   Electronically to        [facility]  [id number] (retail)       [address]       [place], [place]  [address]       Ph: [phone] or [phone]       Fax: [phone]   RxID:   [id number]

## 2011-05-16 NOTE — Op Note (Signed)
Shannon Figueroa, Shannon Figueroa                  ACCOUNT NO.:  [id number]   MEDICAL RECORD NO.:  [id number]          PATIENT TYPE:  AMB   LOCATION:  SDS                          FACILITY:  [facility]   PHYSICIAN:  Clydene Fake, M.D.  DATE OF BIRTH:  1951-12-21   DATE OF PROCEDURE:  10/22/2006  DATE OF DISCHARGE:                                 OPERATIVE REPORT   PREOPERATIVE DIAGNOSIS:  Atrophy, left L5-S1.   POSTOPERATIVE DIAGNOSIS:  Atrophy, left L5-S1.   PROCEDURE PERFORMED:  Left L5-S1 semi hemilaminectomy and diskectomy,  microdissection with microscope.   SURGEON:  Clydene Fake, M.D.   ASSISTANT:  Coletta Memos, M.D.   ANESTHESIA:  General endotracheal tube anesthesia.   ESTIMATED BLOOD LOSS:  Minimal.   BLOOD GIVEN:  None.   DRAINS:  None.   COMPLICATIONS:  None.   REASON FOR PROCEDURE:  The patient is a 60 year old woman who has had back  and left leg pain and numbness with 5/5 strength, left plantar flexion.  Sensation slightly decreased in left S1 distribution, and deep tendon  reflexes are absent in her left ankle.  An MRI showed a large disk  herniation on the left side of L5-S1.  The patient was brought in for  diskectomy.   DESCRIPTION OF PROCEDURE:  The patient was brought to the operating room  where general anesthesia was induced.  The patient was placed in the prone  position on the Wilson frame with all pressure points padded.  The patient  was prepped and draped in sterile fashion.  The site of the incision was  injected with 10 mL of 1% lidocaine with epinephrine.  The needle was then  placed in the back towards the interspace.  X-ray was obtained, showing the  needle as pointing at the L5-S1 disk, right in the middle of the L5 lamina.  The needle was removed and the incision was then made in the center, just  below where the needle was.  The incision was taken down to the fascia and  hemostasis was obtained with Bovie cauterization.  The fascia was incised on  the left side and subperiosteal dissection was done over the L5 and S1  spinous process and lamina out to the facet.  A self-retaining retractor was  placed.  A marker was placed in the interspace.  Another x-ray was obtained,  confirming our position in the L5-S1.  A microscope was brought in for  microdissection at this point.  A high-speed drill was used to start a semi  hemilaminectomy, medial facetectomy completed with Kerrison punches.  The  ligamentum flavum was removed and we explored the disk space.  The S1 root  was pushed laterally and posteriorly, and the axilla was opened and we  placed a nerve hook there immediately to exclude it from the disk.  We came  out to the axilla.  We were able to remove 3-4 very large pieces.  We then  dissected lateral to the nerve root and we were able to refect the nerve and  dural sac medially with the root retractor,  and saw the large disk  herniation and incised the disk space and performed a diskectomy with  pituitary rongeurs and curettes and Kerrison punches.  We had to push what  felt like a disk bulge down into the disk space and then removed it with  pituitary rongeurs.  When we were finished, we had good decompression of the  spinal canal with good decompression of the central canal and the S1 nerve  root.  We checked the L5 root and there was no pressure and went out of its  foramen well.  We had hemostasis with Gelfoam and thrombin.  This was  irrigated out.  We had good hemostasis.  We had good decompression of the  nerve root.  The wound was irrigated with antibiotic solution.  The  retractors were removed.  The fascia was closed with 0-Vicryl interrupted  suture, the subcutaneous tissue closed with 0-Vicryl, 2-0 Vicryl and 3-0  Vicryl interrupted sutures.  The skin was closed with benzoin and Steri-  Strips.  A dressing was placed.  The patient was placed back into supine  position, awakened from anesthesia, and transferred to the  recovery room in  stable condition.           ______________________________  Clydene Fake, M.D.     JRH/MEDQ  D:  10/22/2006  T:  10/23/2006  Job:  [id number]

## 2012-06-29 ENCOUNTER — Other Ambulatory Visit: Payer: Self-pay | Admitting: Family Medicine

## 2012-06-29 DIAGNOSIS — Z1231 Encounter for screening mammogram for malignant neoplasm of breast: Secondary | ICD-10-CM

## 2012-07-15 ENCOUNTER — Ambulatory Visit: Payer: Self-pay

## 2012-07-19 ENCOUNTER — Ambulatory Visit: Payer: Self-pay

## 2012-08-08 ENCOUNTER — Encounter (HOSPITAL_COMMUNITY): Payer: Self-pay | Admitting: *Deleted

## 2012-08-08 ENCOUNTER — Emergency Department (HOSPITAL_COMMUNITY)
Admission: EM | Admit: 2012-08-08 | Discharge: 2012-08-08 | Disposition: A | Discharge status: Home / Self Care | Payer: No Typology Code available for payment source | Attending: Emergency Medicine | Admitting: Emergency Medicine

## 2012-08-08 DIAGNOSIS — E079 Disorder of thyroid, unspecified: Secondary | ICD-10-CM | POA: Insufficient documentation

## 2012-08-08 DIAGNOSIS — M25519 Pain in unspecified shoulder: Secondary | ICD-10-CM | POA: Insufficient documentation

## 2012-08-08 DIAGNOSIS — K219 Gastro-esophageal reflux disease without esophagitis: Secondary | ICD-10-CM | POA: Insufficient documentation

## 2012-08-08 DIAGNOSIS — Z880 Allergy status to penicillin: Secondary | ICD-10-CM | POA: Insufficient documentation

## 2012-08-08 DIAGNOSIS — M542 Cervicalgia: Secondary | ICD-10-CM | POA: Insufficient documentation

## 2012-08-08 HISTORY — DX: Disorder of thyroid, unspecified: E07.9

## 2012-08-08 MED ORDER — DIAZEPAM 5 MG PO TABS: 5.0000 mg | ORAL_TABLET | Freq: Every day | ORAL | Status: AC

## 2012-08-08 MED ORDER — KETOROLAC TROMETHAMINE 60 MG/2ML IM SOLN
30.0000 mg | Freq: Once | INTRAMUSCULAR | Status: AC
  Administered 2012-08-08: 12:00:00 via INTRAMUSCULAR
  Filled 2012-08-08: qty 2
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
## 2012-12-31 ENCOUNTER — Other Ambulatory Visit: Payer: Self-pay | Admitting: Dermatology

## 2012-12-31 ENCOUNTER — Ambulatory Visit: Payer: 59

## 2013-02-03 ENCOUNTER — Ambulatory Visit
Admission: RE | Admit: 2013-02-03 | Discharge: 2013-02-03 | Disposition: A | Discharge status: Home / Self Care | Payer: 59 | Source: Ambulatory Visit | Attending: Family Medicine | Admitting: Family Medicine

## 2013-02-03 DIAGNOSIS — Z1231 Encounter for screening mammogram for malignant neoplasm of breast: Secondary | ICD-10-CM

## 2014-02-27 ENCOUNTER — Other Ambulatory Visit: Payer: Self-pay

## 2014-02-27 DIAGNOSIS — Z1231 Encounter for screening mammogram for malignant neoplasm of breast: Secondary | ICD-10-CM

## 2014-03-29 ENCOUNTER — Ambulatory Visit
Admission: RE | Admit: 2014-03-29 | Discharge: 2014-03-29 | Disposition: A | Discharge status: Home / Self Care | Payer: 59 | Source: Ambulatory Visit

## 2014-03-29 DIAGNOSIS — Z1231 Encounter for screening mammogram for malignant neoplasm of breast: Secondary | ICD-10-CM

## 2014-12-29 HISTORY — PX: COLONOSCOPY: SHX174

## 2015-06-27 ENCOUNTER — Encounter: Payer: Self-pay | Admitting: Gastroenterology

## 2015-08-23 ENCOUNTER — Other Ambulatory Visit: Payer: Self-pay

## 2015-08-23 ENCOUNTER — Encounter: Payer: Self-pay | Admitting: Gastroenterology

## 2015-08-23 DIAGNOSIS — Z1231 Encounter for screening mammogram for malignant neoplasm of breast: Secondary | ICD-10-CM

## 2015-10-03 ENCOUNTER — Ambulatory Visit: Payer: 59

## 2015-10-31 ENCOUNTER — Ambulatory Visit (AMBULATORY_SURGERY_CENTER): Payer: Self-pay | Admitting: *Deleted

## 2015-10-31 ENCOUNTER — Ambulatory Visit
Admission: RE | Admit: 2015-10-31 | Discharge: 2015-10-31 | Disposition: A | Discharge status: Home / Self Care | Payer: 59 | Source: Ambulatory Visit

## 2015-10-31 VITALS — Ht 69.0 in | Wt 246.6 lb

## 2015-10-31 DIAGNOSIS — Z86010 Personal history of colonic polyps: Secondary | ICD-10-CM

## 2015-10-31 DIAGNOSIS — Z1231 Encounter for screening mammogram for malignant neoplasm of breast: Secondary | ICD-10-CM

## 2015-10-31 MED ORDER — NA SULFATE-K SULFATE-MG SULF 17.5-3.13-1.6 GM/177ML PO SOLN: 1.0000 | Freq: Once | ORAL | Status: DC

## 2015-10-31 NOTE — Progress Notes (Signed)
No egg or soy allergy No issues with past sedation- hx of post of N/V  No diet pills No home 02 use   emmi video to e mail    [email or web address]

## 2015-11-14 ENCOUNTER — Encounter: Payer: Self-pay | Admitting: Gastroenterology

## 2015-11-14 ENCOUNTER — Ambulatory Visit (AMBULATORY_SURGERY_CENTER): Payer: 59 | Admitting: Gastroenterology

## 2015-11-14 VITALS — BP 138/82 | HR 74 | Temp 98.5°F | Resp 16 | Ht 69.0 in | Wt 246.0 lb

## 2015-11-14 DIAGNOSIS — Z86010 Personal history of colonic polyps: Secondary | ICD-10-CM | POA: Diagnosis not present

## 2015-11-14 DIAGNOSIS — D123 Benign neoplasm of transverse colon: Secondary | ICD-10-CM | POA: Diagnosis not present

## 2015-11-14 DIAGNOSIS — D124 Benign neoplasm of descending colon: Secondary | ICD-10-CM

## 2015-11-14 MED ORDER — SODIUM CHLORIDE 0.9 % IV SOLN: 500.0000 mL | INTRAVENOUS | Status: DC

## 2015-11-14 NOTE — Op Note (Signed)
[facility]  [address] [place], [id number]   COLONOSCOPY PROCEDURE REPORT  PATIENT: Shannon Figueroa, Shannon Figueroa  MR#: [id number] BIRTHDATE: 07/17/1951 , 89  yrs. old GENDER: female ENDOSCOPIST: Ladene Artist, MD, [facility] REFERRED [id number] Howard, M.D. PROCEDURE DATE:  11/14/2015 PROCEDURE:   Colonoscopy, surveillance , Colonoscopy with biopsy, and Colonoscopy with snare polypectomy First Screening Colonoscopy - Avg.  risk and is 50 yrs.  old or older - No.  Prior Negative Screening - Now for repeat screening. N/A  History of Adenoma - Now for follow-up colonoscopy & has been > or = to 3 yrs.  Yes hx of adenoma.  Has been 3 or more years since last colonoscopy.  Polyps removed today? Yes ASA CLASS:   Class II INDICATIONS:Surveillance due to prior colonic neoplasia and PH Colon Adenoma. MEDICATIONS: Monitored anesthesia care and Propofol 200 mg IV DESCRIPTION OF PROCEDURE:   After the risks benefits and alternatives of the procedure were thoroughly explained, informed consent was obtained.  The digital rectal exam revealed no abnormalities of the rectum.   The LB PFC-H190 [id number]  endoscope was introduced through the anus and advanced to the cecum, which was identified by both the appendix and ileocecal valve. No adverse events experienced.   The quality of the prep was good.  (Suprep was used)  The instrument was then slowly withdrawn as the colon was fully examined. Estimated blood loss is zero unless otherwise noted in this procedure report.  COLON FINDINGS: Two sessile polyps measuring 6-7 mm in size were found in the transverse colon.  Polypectomies were performed with a cold snare.  The resection was complete, the polyp tissue was completely retrieved and sent to histology.   Two sessile polyps measuring 4-5 mm in size were found in the descending colon. Polypectomies were performed with cold forceps.  The resection was complete, the polyp tissue was completely  retrieved and sent to histology.   There was mild diverticulosis noted in the sigmoid colon.   The examination was otherwise normal.  Retroflexed views revealed no abnormalities. The time to cecum = 1.9 Withdrawal time = 10.0   The scope was withdrawn and the procedure completed. COMPLICATIONS: There were no immediate complications.  ENDOSCOPIC IMPRESSION: 1.   Two sessile polyps in the transverse colon; polypectomies performed with a cold snare 2.   Two sessile polyps in the descending colon; polypectomies performed with cold forceps 3.   Mild diverticulosis in the sigmoid colon  RECOMMENDATIONS: 1.  Await pathology results 2.  High fiber diet with liberal fluid intake. 3.  Repeat Colonoscopy in 5 years.  eSigned:  Ladene Artist, MD, [facility] 11/14/2015 9:21 AM

## 2015-11-14 NOTE — Progress Notes (Signed)
A/ox3 pleased with MAC, report to Celia RN 

## 2015-11-14 NOTE — Progress Notes (Signed)
Called to room to assist during endoscopic procedure.  Patient ID and intended procedure confirmed with present staff. Received instructions for my participation in the procedure from the performing physician.  

## 2015-11-14 NOTE — Patient Instructions (Signed)
Discharge instructions given. Handouts on polyps and diverticulosis. Resume previous medications. YOU HAD AN ENDOSCOPIC PROCEDURE TODAY AT THE [facility]:   Refer to the procedure report that was given to you for any specific questions about what was found during the examination.  If the procedure report does not answer your questions, please call your gastroenterologist to clarify.  If you requested that your care partner not be given the details of your procedure findings, then the procedure report has been included in a sealed envelope for you to review at your convenience later.  YOU SHOULD EXPECT: Some feelings of bloating in the abdomen. Passage of more gas than usual.  Walking can help get rid of the air that was put into your GI tract during the procedure and reduce the bloating. If you had a lower endoscopy (such as a colonoscopy or flexible sigmoidoscopy) you may notice spotting of blood in your stool or on the toilet paper. If you underwent a bowel prep for your procedure, you may not have a normal bowel movement for a few days.  Please Note:  You might notice some irritation and congestion in your nose or some drainage.  This is from the oxygen used during your procedure.  There is no need for concern and it should clear up in a day or so.  SYMPTOMS TO REPORT IMMEDIATELY:   Following lower endoscopy (colonoscopy or flexible sigmoidoscopy):  Excessive amounts of blood in the stool  Significant tenderness or worsening of abdominal pains  Swelling of the abdomen that is new, acute  Fever of 100F or higher   For urgent or emergent issues, a gastroenterologist can be reached at any hour by calling [phone].   DIET: Your first meal following the procedure should be a small meal and then it is ok to progress to your normal diet. Heavy or fried foods are harder to digest and may make you feel nauseous or bloated.  Likewise, meals heavy in dairy and vegetables can  increase bloating.  Drink plenty of fluids but you should avoid alcoholic beverages for 24 hours.  ACTIVITY:  You should plan to take it easy for the rest of today and you should NOT DRIVE or use heavy machinery until tomorrow (because of the sedation medicines used during the test).    FOLLOW UP: Our staff will call the number listed on your records the next business day following your procedure to check on you and address any questions or concerns that you may have regarding the information given to you following your procedure. If we do not reach you, we will leave a message.  However, if you are feeling well and you are not experiencing any problems, there is no need to return our call.  We will assume that you have returned to your regular daily activities without incident.  If any biopsies were taken you will be contacted by phone or by letter within the next 1-3 weeks.  Please call us at [phone] if you have not heard about the biopsies in 3 weeks.    SIGNATURES/CONFIDENTIALITY: You and/or your care partner have signed paperwork which will be entered into your electronic medical record.  These signatures attest to the fact that that the information above on your After Visit Summary has been reviewed and is understood.  Full responsibility of the confidentiality of this discharge information lies with you and/or your care-partner. 

## 2015-11-15 ENCOUNTER — Telehealth: Payer: Self-pay | Admitting: Emergency Medicine

## 2015-11-15 NOTE — Telephone Encounter (Signed)
Left message, identifier present 

## 2015-11-27 ENCOUNTER — Encounter: Payer: Self-pay | Admitting: Gastroenterology

## 2017-02-05 ENCOUNTER — Ambulatory Visit (INDEPENDENT_AMBULATORY_CARE_PROVIDER_SITE_OTHER): Payer: 59 | Admitting: Nurse Practitioner

## 2017-02-05 ENCOUNTER — Other Ambulatory Visit (INDEPENDENT_AMBULATORY_CARE_PROVIDER_SITE_OTHER): Payer: 59

## 2017-02-05 ENCOUNTER — Encounter: Payer: Self-pay | Admitting: Nurse Practitioner

## 2017-02-05 ENCOUNTER — Encounter (INDEPENDENT_AMBULATORY_CARE_PROVIDER_SITE_OTHER): Payer: Self-pay

## 2017-02-05 VITALS — BP 126/80 | HR 90 | Ht 69.0 in | Wt 250.0 lb

## 2017-02-05 DIAGNOSIS — K219 Gastro-esophageal reflux disease without esophagitis: Secondary | ICD-10-CM

## 2017-02-05 DIAGNOSIS — R1012 Left upper quadrant pain: Secondary | ICD-10-CM

## 2017-02-05 LAB — COMPREHENSIVE METABOLIC PANEL
ALT: 12 U/L (ref 0–35)
AST: 14 U/L (ref 0–37)
Albumin: 4.2 g/dL (ref 3.5–5.2)
Alkaline Phosphatase: 79 U/L (ref 39–117)
BILIRUBIN TOTAL: 0.7 mg/dL (ref 0.2–1.2)
BUN: 11 mg/dL (ref 6–23)
CO2: 29 meq/L (ref 19–32)
Calcium: 9.3 mg/dL (ref 8.4–10.5)
Chloride: 106 mEq/L (ref 96–112)
Creatinine, Ser: 1.02 mg/dL (ref 0.40–1.20)
GFR: 58.01 mL/min — ABNORMAL LOW (ref >60.00)
Glucose, Bld: 137 mg/dL — ABNORMAL HIGH (ref 70–99)
Potassium: 3.9 mEq/L (ref 3.5–5.1)
Sodium: 142 mEq/L (ref 135–145)
Total Protein: 6.8 g/dL (ref 6.0–8.3)

## 2017-02-05 LAB — CBC WITH DIFFERENTIAL/PLATELET
BASOS ABS: 0 10*3/uL (ref 0.0–0.1)
BASOS PCT: 0.3 % (ref 0.0–3.0)
Eosinophils Absolute: 0 10*3/uL (ref 0.0–0.7)
Eosinophils Relative: 0.1 % (ref 0.0–5.0)
HEMATOCRIT: 40 % (ref 36.0–46.0)
Hemoglobin: 13.3 g/dL (ref 12.0–15.0)
LYMPHS PCT: 18.5 % (ref 12.0–46.0)
Lymphs Abs: 1.2 10*3/uL (ref 0.7–4.0)
MCHC: 33.3 g/dL (ref 30.0–36.0)
MCV: 82.9 fl (ref 78.0–100.0)
MONOS PCT: 6 % (ref 3.0–12.0)
Monocytes Absolute: 0.4 10*3/uL (ref 0.1–1.0)
NEUTROS ABS: 4.8 10*3/uL (ref 1.4–7.7)
Neutrophils Relative %: 75.1 % (ref 43.0–77.0)
PLATELETS: 194 10*3/uL (ref 150.0–400.0)
RBC: 4.82 Mil/uL (ref 3.87–5.11)
RDW: 15.5 % (ref 11.5–15.5)
WBC: 6.4 10*3/uL (ref 4.0–10.5)

## 2017-02-05 LAB — LIPASE: LIPASE: 5 U/L — AB (ref 11.0–59.0)

## 2017-02-05 NOTE — Progress Notes (Signed)
     HPI: Patient is a 66 year old female known to Dr. Fuller Plan for a history of adenomatous colon polyps is up-to-date on her surveillance colonoscopy, last one was in 2016.  Patient is here for evaluation of abdominal pain and GERD. She had been having intermittent left upper quadrant just under the rib cage . Pain nonradiating,  unrelated to meals. Episodes generally last 30-40 minutes. No associated nausea. In January PCP ordered an ultrasound which revealed cholelithiasis and fatty infiltration of the liver. No evidence for acute cholecystitis. Common bile duct was normal caliber. Patient on chronic twice a day PPI. Additionally she is taken Zantac at bedtime for 6 years. PCP added a Zantac in the morning and her left upper quadrant pain has significantly improved. She is still having heartburn and regurgitation at least twice a week despite twice a day PPI and twice a day H2 blocker. Sleeps with the head of her bed elevated, goes to bed on an empty stomach. She takes Gaviscon as needed. having reflux into her mouth  Past Medical History:  Diagnosis Date  . Allergy   . GERD (gastroesophageal reflux disease)   . Graves disease   . History of colon polyps   . Thyroid disease     Patient's surgical history, family medical history, social history, medications and allergies were all reviewed in Epic    Physical Exam: BP 126/80   Pulse 90   Ht 5\' 9"  (1.753 m)   Wt 250 lb (113.4 kg)   BMI 36.92 kg/m   GENERAL:white female in NAD PSYCH: :Pleasant, cooperative, normal affect HEENT: Normocephalic, conjunctiva pink, mucous membranes moist, neck supple without masses CARDIAC:  Regular rate and rhythm, no peripheral edema  PULM: Normal respiratory effort, lungs CTA bilaterally, no wheezing ABDOMEN:  soft, nontender, nondistended, no obvious masses, no hepatomegaly,  normal bowel sounds SKIN:  turgor, no lesions seen Musculoskeletal:  Normal muscle tone, normal strength NEURO: Alert and  oriented x 3, no focal neurologic deficits    ASSESSMENT and PLAN:  60. 66 year old female with recent intermittent episodes of left upper quadrant pain. Patient has been on twice a day PPI and Zantac at bedtime for years. Oddly enough for left upper quadrant pain improved after adding a dose of Zantac in the morning -No recent labs for review. Will obtain basic labs today.   2. GERD, still symptomatic on BID PPI and BID H2 blocker with heartburn and regurgitation -She sleeps with the head elevated and seems to be compliant with other anti-reflux measures -Long-term she should not need to be on both PPI and H2 blockers twice daily but for the time being it is working so she will continue..  -At time of last EGD in 2011 patient had a 6 cm hiatal hernia. Even refractory GERD symptoms will obtain an upper GI to reevaluate hiatal hernia. She may need surgical evaluation. We will call her with results  3. Cholelithiasis without evidence for cholecystitis on u/s. Had some pain between her shoulder blades which lasted a couple of weeks in September. I don't think any of her symptoms are biliary in nature at this point.   Tye Savoy , NP 02/05/2017, 9:03 AM

## 2017-02-05 NOTE — Patient Instructions (Signed)
If you are age 64 or older, your body mass index should be between 23-30. Your Body mass index is 36.92 kg/m. If this is out of the aforementioned range listed, please consider follow up with your Primary Care Provider.  If you are age 46 or younger, your body mass index should be between 19-25. Your Body mass index is 36.92 kg/m. If this is out of the aformentioned range listed, please consider follow up with your Primary Care Provider.   Your physician has requested that you go to the basement for the following lab work before leaving today:  CBC, CMET, Lipase  You have been scheduled for an Upper GI Series and Small Bowel Follow Thru at [facility] Radiology Department located on the first floor. Your appointment is on  Monday February 12th at 8:00am. Please arrive 15 minutes prior to your test for registration. Make certain not to have anything to eat or drink 3 hours prior to your test. If you need to reschedule, please contact radiology at [phone]. --------------------------------------------------------------------------------------------------------------- An upper GI series uses x rays to help diagnose problems of the upper GI tract, which includes the esophagus, stomach, and duodenum. The duodenum is the first part of the small intestine. An upper GI series is conducted by a radiology technologist or a radiologist-a doctor who specializes in x-ray imaging-at a hospital or outpatient center. While sitting or standing in front of an x-ray machine, the patient drinks barium liquid, which is often white and has a chalky consistency and taste. The barium liquid coats the lining of the upper GI tract and makes signs of disease show up more clearly on x rays. X-ray video, called fluoroscopy, is used to view the barium liquid moving through the esophagus, stomach, and duodenum. Additional x rays and fluoroscopy are performed while the patient lies on an x-ray table. To fully coat the upper GI  tract with barium liquid, the technologist or radiologist may press on the abdomen or ask the patient to change position. Patients hold still in various positions, allowing the technologist or radiologist to take x rays of the upper GI tract at different angles. If a technologist conducts the upper GI series, a radiologist will later examine the images to look for problems.  This test typically takes about 1 hour to complete --------------------------------------------------------------------------------------------------------------------------------------------- The Small Bowel Follow Thru examination is used to visualize the entire small bowel (intestines); specifically the connection between the small and large intestine. You will be positioned on a flat x-ray table and an image of your abdomen taken. Then the technologist will show the x-ray to the radiologist. The radiologist will instruct your technologist how much (1-2 cups) barium sulfate you will drink and when to begin taking the timed x-rays, usually 15-30 minutes after you begin drinking. Barium is a harmless substance that will highlight your small intestine by absorbing x-ray. The taste is chalky and it feels very heavy both in the cup and in your stomach.  After the first x-ray is taken and shown to the radiologist, he/she will determine when the next image is to be taken. This is repeated until the barium has reached the end of the small intestine and enters the beginning of the colon (cecum). At such time when the barium spills into the colon, you will be positioned on the x-ray table once again. The radiologist will use a fluoroscopic camera to take some detailed pictures of the connection between your small intestine and colon. The fluoroscope is an x-ray unit that  works with a Chiropractor. The radiologist will apply pressure to your abdomen with his/her hand and a lead glove, a plastic paddle, or a paddle with an inflated  rubber balloon on the end. This is to spread apart your loops of intestine so he/she can see all areas.   This test typically takes around 1 hour to complete.  Important. Drink plenty of water (8-10 cups/day) for a few days following the procedure to avoid constipation and blockage. The barium will make your stools white for a few days.  We will contact you with results.  Thank you. --------------------------------------------------------------------------------------------------------------------------------------------

## 2017-02-09 ENCOUNTER — Ambulatory Visit (HOSPITAL_COMMUNITY)
Admission: RE | Admit: 2017-02-09 | Discharge: 2017-02-09 | Disposition: A | Discharge status: Home / Self Care | Payer: 59 | Source: Ambulatory Visit | Attending: Nurse Practitioner | Admitting: Nurse Practitioner

## 2017-02-09 DIAGNOSIS — R1013 Epigastric pain: Secondary | ICD-10-CM | POA: Diagnosis not present

## 2017-02-09 DIAGNOSIS — K449 Diaphragmatic hernia without obstruction or gangrene: Secondary | ICD-10-CM | POA: Diagnosis not present

## 2017-02-09 DIAGNOSIS — K5710 Diverticulosis of small intestine without perforation or abscess without bleeding: Secondary | ICD-10-CM | POA: Diagnosis not present

## 2017-02-09 DIAGNOSIS — K210 Gastro-esophageal reflux disease with esophagitis: Secondary | ICD-10-CM | POA: Diagnosis not present

## 2017-02-09 DIAGNOSIS — K219 Gastro-esophageal reflux disease without esophagitis: Secondary | ICD-10-CM | POA: Diagnosis present

## 2017-02-09 DIAGNOSIS — R1012 Left upper quadrant pain: Secondary | ICD-10-CM

## 2017-02-09 NOTE — Progress Notes (Signed)
Reviewed and agree with initial management plan.  Kanen Mottola T. Delesha Pohlman, MD FACG 

## 2017-02-20 ENCOUNTER — Telehealth: Payer: Self-pay

## 2017-02-20 NOTE — Telephone Encounter (Signed)
-----   Message from Willia Craze, NP sent at 02/19/2017  4:21 PM EST ----- Eustaquio Maize, can you please let patient know that she has a moderate size hiatal hernia on UGI series. This could be contributing to her terrible reflux. We will likely end up sending her for a surgical evaluation but first she needs an EGD with Fuller Plan and an esophageal manometry with Nandigam. The surgeons would want this things done anyway before surgery. Can you schedule both of these tests please. Thanks. If she has any questions just let me know.  Nevin Bloodgood

## 2017-02-20 NOTE — Telephone Encounter (Signed)
Patient is aware of her results. She will discuss this with her husband and call me back with her decision.

## 2017-02-23 NOTE — Telephone Encounter (Signed)
Patient is aware of her Manometry results. She and her husband have discussed her options. She has decided not to pursue the avenue of surgical repair for now.

## 2017-02-25 NOTE — Telephone Encounter (Signed)
Information on management of reflux mailed to patient. She is aware to call back if she changes her mind or has other issues.

## 2017-02-25 NOTE — Telephone Encounter (Signed)
Ok. If she reconsiders then will go from there. Shannon Figueroa, please remind her about strict anti-reflux measures. Can you put this in a phone note or on the UGI result or somewhere that will be part of her chart? Thanks

## 2017-05-28 ENCOUNTER — Other Ambulatory Visit: Payer: Self-pay | Admitting: Family Medicine

## 2017-05-28 DIAGNOSIS — Z1231 Encounter for screening mammogram for malignant neoplasm of breast: Secondary | ICD-10-CM

## 2017-06-15 ENCOUNTER — Ambulatory Visit
Admission: RE | Admit: 2017-06-15 | Discharge: 2017-06-15 | Disposition: A | Discharge status: Home / Self Care | Payer: 59 | Source: Ambulatory Visit | Attending: Family Medicine | Admitting: Family Medicine

## 2017-06-15 DIAGNOSIS — Z1231 Encounter for screening mammogram for malignant neoplasm of breast: Secondary | ICD-10-CM

## 2017-11-09 ENCOUNTER — Ambulatory Visit (INDEPENDENT_AMBULATORY_CARE_PROVIDER_SITE_OTHER): Payer: POS | Admitting: *Deleted

## 2017-11-09 DIAGNOSIS — Z23 Encounter for immunization: Secondary | ICD-10-CM

## 2019-06-09 ENCOUNTER — Other Ambulatory Visit: Payer: Self-pay | Admitting: Family Medicine

## 2019-06-09 DIAGNOSIS — Z1231 Encounter for screening mammogram for malignant neoplasm of breast: Secondary | ICD-10-CM

## 2019-07-27 ENCOUNTER — Other Ambulatory Visit: Payer: Self-pay

## 2019-07-27 ENCOUNTER — Ambulatory Visit
Admission: RE | Admit: 2019-07-27 | Discharge: 2019-07-27 | Disposition: A | Discharge status: Home / Self Care | Payer: POS | Source: Ambulatory Visit | Attending: Family Medicine | Admitting: Family Medicine

## 2019-07-27 DIAGNOSIS — Z1231 Encounter for screening mammogram for malignant neoplasm of breast: Secondary | ICD-10-CM

## 2020-06-25 ENCOUNTER — Other Ambulatory Visit: Payer: Self-pay | Admitting: Family Medicine

## 2020-06-25 DIAGNOSIS — Z1231 Encounter for screening mammogram for malignant neoplasm of breast: Secondary | ICD-10-CM

## 2020-07-27 ENCOUNTER — Ambulatory Visit
Admission: RE | Admit: 2020-07-27 | Discharge: 2020-07-27 | Disposition: A | Discharge status: Home / Self Care | Payer: POS | Source: Ambulatory Visit | Attending: Family Medicine | Admitting: Family Medicine

## 2020-07-27 ENCOUNTER — Other Ambulatory Visit: Payer: Self-pay

## 2020-07-27 DIAGNOSIS — Z1231 Encounter for screening mammogram for malignant neoplasm of breast: Secondary | ICD-10-CM

## 2021-01-08 ENCOUNTER — Encounter: Payer: Self-pay | Admitting: Gastroenterology

## 2021-02-12 ENCOUNTER — Other Ambulatory Visit: Payer: Self-pay

## 2021-02-12 ENCOUNTER — Ambulatory Visit (AMBULATORY_SURGERY_CENTER): Payer: Self-pay

## 2021-02-12 VITALS — Ht 69.0 in | Wt 250.0 lb

## 2021-02-12 DIAGNOSIS — Z800 Family history of malignant neoplasm of digestive organs: Secondary | ICD-10-CM

## 2021-02-12 MED ORDER — PLENVU 140 G PO SOLR: 1.0000 | ORAL | 0 refills | Status: DC

## 2021-02-12 NOTE — Progress Notes (Signed)
No allergies to soy or egg Pt is not on blood thinners or diet pills Denies issues with sedation/intubation Denies atrial flutter/fib Denies constipation   Emmi instructions given to pt  Pt is aware of Covid safety and care partner requirements.  

## 2021-02-26 ENCOUNTER — Encounter: Payer: Self-pay | Admitting: Gastroenterology

## 2021-02-26 ENCOUNTER — Other Ambulatory Visit: Payer: Self-pay

## 2021-02-26 ENCOUNTER — Ambulatory Visit (AMBULATORY_SURGERY_CENTER): Payer: Medicare Other | Admitting: Gastroenterology

## 2021-02-26 VITALS — BP 127/58 | HR 87 | Temp 98.4°F | Resp 24 | Ht 69.0 in | Wt 250.0 lb

## 2021-02-26 DIAGNOSIS — Z86010 Personal history of colonic polyps: Secondary | ICD-10-CM | POA: Diagnosis not present

## 2021-02-26 DIAGNOSIS — D124 Benign neoplasm of descending colon: Secondary | ICD-10-CM

## 2021-02-26 DIAGNOSIS — D123 Benign neoplasm of transverse colon: Secondary | ICD-10-CM

## 2021-02-26 MED ORDER — SODIUM CHLORIDE 0.9 % IV SOLN: 500.0000 mL | Freq: Once | INTRAVENOUS | Status: DC

## 2021-02-26 MED ORDER — SODIUM CHLORIDE 0.9 % IV SOLN
4.0000 mg | Freq: Once | INTRAVENOUS | Status: AC
  Administered 2021-02-26: 4 mg via INTRAVENOUS

## 2021-02-26 NOTE — Op Note (Signed)
[place] Patient Name: Shannon Figueroa Procedure Date: 02/26/2021 9:16 AM MRN: [id number] Endoscopist: Ladene Artist , MD Age: 70 Referring MD:  Date of Birth: 08-14-1951 Gender: Female Account #: [id number] Procedure:                Colonoscopy Indications:              Surveillance: Personal history of adenomatous                            polyps on last colonoscopy 5 years ago Medicines:                Monitored Anesthesia Care Procedure:                Pre-Anesthesia Assessment:                           - Prior to the procedure, a History and Physical                            was performed, and patient medications and                            allergies were reviewed. The patient's tolerance of                            previous anesthesia was also reviewed. The risks                            and benefits of the procedure and the sedation                            options and risks were discussed with the patient.                            All questions were answered, and informed consent                            was obtained. Prior Anticoagulants: The patient has                            taken no previous anticoagulant or antiplatelet                            agents. ASA Grade Assessment: III - A patient with                            severe systemic disease. After reviewing the risks                            and benefits, the patient was deemed in                            satisfactory condition to undergo the procedure.  After obtaining informed consent, the colonoscope                            was passed under direct vision. Throughout the                            procedure, the patient's blood pressure, pulse, and                            oxygen saturations were monitored continuously. The                            Olympus PFC-[id number] (#[id number]) Colonoscope was                            introduced through the anus  and advanced to the the                            cecum, identified by appendiceal orifice and                            ileocecal valve. The ileocecal valve, appendiceal                            orifice, and rectum were photographed. The quality                            of the bowel preparation was adequate. The                            colonoscopy was performed without difficulty. The                            patient tolerated the procedure well. Scope In: 9:28:45 AM Scope Out: 8:29:93 AM Scope Withdrawal Time: 0 hours 15 minutes 45 seconds  Total Procedure Duration: 0 hours 18 minutes 9 seconds  Findings:                 The perianal and digital rectal examinations were                            normal.                           Two sessile polyps were found in the descending                            colon and transverse colon. The polyps were 7 to 8                            mm in size. These polyps were removed with a cold                            snare. Resection and retrieval were complete.  A few medium-mouthed diverticula were found in the                            right colon. There was no evidence of diverticular                            bleeding.                           Multiple medium-mouthed diverticula were found in                            the left colon. There was evidence of diverticular                            spasm. There was no evidence of diverticular                            bleeding.                           The exam was otherwise without abnormality on                            direct and retroflexion views. Complications:            No immediate complications. Estimated blood loss:                            None. Estimated Blood Loss:     Estimated blood loss: none. Impression:               - Two 7 to 8 mm polyps in the descending colon and                            in the transverse colon, removed with  a cold snare.                            Resected and retrieved.                           - Mild diverticulosis in the right colon.                           - Moderate diverticulosis in the left colon.                           - The examination was otherwise normal on direct                            and retroflexion views. Recommendation:           - Repeat colonoscopy after studies are complete for                            surveillance based on pathology results.                           -  Patient has a contact number available for                            emergencies. The signs and symptoms of potential                            delayed complications were discussed with the                            patient. Return to normal activities tomorrow.                            Written discharge instructions were provided to the                            patient.                           - High fiber diet.                           - Continue present medications.                           - Await pathology results. Ladene Artist, MD 02/26/2021 9:53:48 AM This report has been signed electronically.

## 2021-02-26 NOTE — Progress Notes (Signed)
Pt's states no medical or surgical changes since previsit or office visit. 

## 2021-02-26 NOTE — Progress Notes (Signed)
Called to room to assist during endoscopic procedure.  Patient ID and intended procedure confirmed with present staff. Received instructions for my participation in the procedure from the performing physician.  

## 2021-02-26 NOTE — Progress Notes (Signed)
Pt c/o nausea and vomiting x1 after completing her prep this am.  Pt c/o nausea still in admitting.  Reported to Dr. Fuller Plan and Earnestine Mealing, CRNA.  Vocal order from Dr. Fuller Plan Zofran (Ondansetron) 4 mg IV and bolus in 0.9% NS. I reviewed pt's allergies and no allergy to Zofran. IV opened to wide open drip and administered Zofran 4 mg given IV at 08:50 By Barbee Cough, RN.

## 2021-02-26 NOTE — Progress Notes (Signed)
pt tolerated well. VSS. awake and to recovery. Report given to RN. OA removed with ease. No trauma.

## 2021-02-26 NOTE — Progress Notes (Signed)
C.W. vital signs. 

## 2021-02-26 NOTE — Patient Instructions (Signed)
Please read over handouts about polyps and diverticulosis  Await pathology  Continue your normal medications  YOU HAD AN ENDOSCOPIC PROCEDURE TODAY AT [place]:   Refer to the procedure report that was given to you for any specific questions about what was found during the examination.  If the procedure report does not answer your questions, please call your gastroenterologist to clarify.  If you requested that your care partner not be given the details of your procedure findings, then the procedure report has been included in a sealed envelope for you to review at your convenience later.  YOU SHOULD EXPECT: Some feelings of bloating in the abdomen. Passage of more gas than usual.  Walking can help get rid of the air that was put into your GI tract during the procedure and reduce the bloating. If you had a lower endoscopy (such as a colonoscopy or flexible sigmoidoscopy) you may notice spotting of blood in your stool or on the toilet paper. If you underwent a bowel prep for your procedure, you may not have a normal bowel movement for a few days.  Please Note:  You might notice some irritation and congestion in your nose or some drainage.  This is from the oxygen used during your procedure.  There is no need for concern and it should clear up in a day or so.  SYMPTOMS TO REPORT IMMEDIATELY:   Following lower endoscopy (colonoscopy or flexible sigmoidoscopy):  Excessive amounts of blood in the stool  Significant tenderness or worsening of abdominal pains  Swelling of the abdomen that is new, acute  Fever of 100F or higher  For urgent or emergent issues, a gastroenterologist can be reached at any hour by calling [phone]. Do not use MyChart messaging for urgent concerns.    DIET:  We do recommend a small meal at first, but then you may proceed to your regular diet.  Drink plenty of fluids but you should avoid alcoholic beverages for 24 hours.  ACTIVITY:  You should  plan to take it easy for the rest of today and you should NOT DRIVE or use heavy machinery until tomorrow (because of the sedation medicines used during the test).    FOLLOW UP: Our staff will call the number listed on your records 48-72 hours following your procedure to check on you and address any questions or concerns that you may have regarding the information given to you following your procedure. If we do not reach you, we will leave a message.  We will attempt to reach you two times.  During this call, we will ask if you have developed any symptoms of COVID 19. If you develop any symptoms (ie: fever, flu-like symptoms, shortness of breath, cough etc.) before then, please call [phone].  If you test positive for Covid 19 in the 2 weeks post procedure, please call and report this information to [place].    If any biopsies were taken you will be contacted by phone or by letter within the next 1-3 weeks.  Please call us at [phone] if you have not heard about the biopsies in 3 weeks.    SIGNATURES/CONFIDENTIALITY: You and/or your care partner have signed paperwork which will be entered into your electronic medical record.  These signatures attest to the fact that that the information above on your After Visit Summary has been reviewed and is understood.  Full responsibility of the confidentiality of this discharge information lies with you and/or your care-partner.

## 2021-02-28 ENCOUNTER — Telehealth: Payer: Self-pay | Admitting: *Deleted

## 2021-02-28 ENCOUNTER — Telehealth: Payer: Self-pay

## 2021-02-28 NOTE — Telephone Encounter (Signed)
No answer for post procedure follow up call. Left voicemail.

## 2021-02-28 NOTE — Telephone Encounter (Signed)
  Follow up Call-  Call back number 02/26/2021  Post procedure Call Back phone  # [phone]  Permission to leave phone message Yes  Some recent data might be hidden     No answer at 2nd attempt follow up phone call.  Left message on voicemail.

## 2021-03-06 ENCOUNTER — Encounter: Payer: Self-pay | Admitting: Gastroenterology

## 2021-06-03 ENCOUNTER — Ambulatory Visit: Payer: POS | Admitting: Nurse Practitioner

## 2021-07-28 NOTE — Progress Notes (Signed)
Cardiology Office Note:    Date:  08/01/2021   ID:  Shannon Figueroa, DOB November 05, 1951, MRN [id number]  PCP:  Rory Percy, MD  Cardiologist:  None  Electrophysiologist:  None   Referring MD: Lavella Lemons, PA   Chief Complaint  Patient presents with   Shortness of Breath    History of Present Illness:    Shannon Figueroa is a 70 y.o. female with a hx of Graves' disease s/p thyroidectomy, GERD who is referred by Aggie Hacker, PA for evaluation of dyspnea.  She reports that she has been having issues with significant fatigue, dyspnea, and lightheadedness.  Reports she is short of breath with minimal exertion.  She denies any chest pain.  Denies any syncope but reports lightheadedness with exertion.  Denies any palpitations.  Does report that she snores, sometimes wakes herself up at night from this.  No smoking history.  Family history includes father had CABG at age 83.  Echocardiogram 03/20/2021 at [facility] showed LVEF 55 to 123456, grade 1 diastolic dysfunction, no significant valvular disease.  Lexiscan Myoview 03/22/2021 at [facility] showed fixed inferior apical perfusion defect, no ischemia, LVEF 75%.   Past Medical History:  Diagnosis Date   Allergy    GERD (gastroesophageal reflux disease)    Graves disease    History of colon polyps    Thyroid disease     Past Surgical History:  Procedure Laterality Date   ABDOMINAL HYSTERECTOMY     BACK SURGERY     BREAST EXCISIONAL BIOPSY Right    benign   COLONOSCOPY  2016   ovaries removed  2010   POLYPECTOMY     THYROIDECTOMY      Current Medications: Current Meds  Medication Sig   fluticasone (FLONASE) 50 MCG/ACT nasal spray Place 1 spray into both nostrils daily.   furosemide (LASIX) 20 MG tablet Take 20 mg by mouth daily.   gabapentin (NEURONTIN) 300 MG capsule Take 300 mg by mouth at bedtime.   levothyroxine (SYNTHROID) 88 MCG tablet Take 88 mcg by mouth in the morning.   meloxicam (MOBIC) 15 MG tablet Take 15 mg by  mouth daily.   pantoprazole (PROTONIX) 40 MG tablet Take 40 mg by mouth 2 (two) times daily.   timolol (TIMOPTIC) 0.5 % ophthalmic solution SMARTSIG:In Eye(s)   zolpidem (AMBIEN) 10 MG tablet Take 10 mg by mouth at bedtime.     Allergies:   Clindamycin/lincomycin, Codeine, Dexlansoprazole, Esomeprazole magnesium, Levofloxacin, Rabeprazole sodium, Valium [diazepam], and Penicillins   Social History   Socioeconomic History   Marital status: Married    Spouse name: Not on file   Number of children: Not on file   Years of education: Not on file   Highest education level: Not on file  Occupational History   Not on file  Tobacco Use   Smoking status: Never   Smokeless tobacco: Never  Vaping Use   Vaping Use: Never used  Substance and Sexual Activity   Alcohol use: No    Alcohol/week: 0.0 standard drinks   Drug use: No   Sexual activity: Not on file  Other Topics Concern   Not on file  Social History Narrative   Not on file   Social Determinants of Health   Financial Resource Strain: Not on file  Food Insecurity: Not on file  Transportation Needs: Not on file  Physical Activity: Not on file  Stress: Not on file  Social Connections: Not on file     Family  History: The patient's family history includes Cancer (age of onset: 92) in her brother; Colon cancer in her maternal grandmother; Rectal cancer (age of onset: 80) in her brother. There is no history of Esophageal cancer or Colon polyps.  ROS:   Please see the history of present illness.     All other systems reviewed and are negative.  EKGs/Labs/Other Studies Reviewed:    The following studies were reviewed today:   EKG:  EKG is  ordered today.  The ekg ordered today demonstrates normal sinus rhythm, rate 93, less than 1 mm ST depressions in leads I, 2 V3-6  Recent Labs: No results found for requested labs within last 8760 hours.  Recent Lipid Panel No results found for: CHOL, TRIG, HDL, CHOLHDL, VLDL, LDLCALC,  LDLDIRECT  Physical Exam:    VS:  BP (!) 132/54 (BP Location: Left Arm, Patient Position: Sitting, Cuff Size: Large)   Pulse 93   Ht '5\' 9"'$  (1.753 m)   Wt 235 lb 6.4 oz (106.8 kg)   SpO2 98%   BMI 34.76 kg/m     Wt Readings from Last 3 Encounters:  08/01/21 235 lb 6.4 oz (106.8 kg)  02/26/21 250 lb (113.4 kg)  02/12/21 250 lb (113.4 kg)     GEN:  Well nourished, well developed in no acute distress HEENT: Normal NECK: No JVD; No carotid bruits LYMPHATICS: No lymphadenopathy CARDIAC: RRR, 2/6 systolic murmur RESPIRATORY:  Clear to auscultation without rales, wheezing or rhonchi  ABDOMEN: Soft, non-tender, non-distended MUSCULOSKELETAL:  No edema; No deformity  SKIN: Warm and dry NEUROLOGIC:  Alert and oriented x 3 PSYCHIATRIC:  Normal affect   ASSESSMENT:    1. Shortness of breath   2. Daytime somnolence   3. Snoring    PLAN:    Dyspnea: Reports dyspnea with minimal exertion.  Echocardiogram 03/20/2021 at [facility] showed LVEF 55 to 123456, grade 1 diastolic dysfunction, no significant valvular disease.  Lexiscan Myoview 03/22/2021 at [facility] showed fixed inferior apical perfusion defect, no ischemia, LVEF 75%.  Will check calcium score to evaluate plaque burden.  As cardiac work-up has been negative, will check PFTs to evaluate for pulmonary etiology  Snoring/daytime somnolence: Suspect OSA, will check sleep study  RTC in 6 months  Medication Adjustments/Labs and Tests Ordered: Current medicines are reviewed at length with the patient today.  Concerns regarding medicines are outlined above.  Orders Placed This Encounter  Procedures   CT CARDIAC SCORING (SELF PAY ONLY)   EKG 12-Lead   Pulmonary function test   Split night study    No orders of the defined types were placed in this encounter.   Patient Instructions  Medication Instructions:  No Changes In Medications at this time.  *If you need a refill on your cardiac medications before your next  appointment, please call your pharmacy*  Testing/Procedures: Your physician has recommended that you have a sleep study. This test records several body functions during sleep, including: brain activity, eye movement, oxygen and carbon dioxide blood levels, heart rate and rhythm, breathing rate and rhythm, the flow of air through your mouth and nose, snoring, body muscle movements, and chest and belly movement. SOMEONE WILL REACH OUT TO YOU TO SCHEDULE THIS   Your physician has recommended that you have a pulmonary function test. Pulmonary Function Tests are a group of tests that measure how well air moves in and out of your lungs.  Dr. Gardiner Rhyme has ordered a CT coronary calcium score. This test is done  at [place]. [employer] [address]. This is $99 out of pocket.  Coronary CalciumScan A coronary calcium scan is an imaging test used to look for deposits of calcium and other fatty materials (plaques) in the inner lining of the blood vessels of the heart (coronary arteries). These deposits of calcium and plaques can partly clog and narrow the coronary arteries without producing any symptoms or warning signs. This puts a person at risk for a heart attack. This test can detect these deposits before symptoms develop. Tell a health care provider about: Any allergies you have. All medicines you are taking, including vitamins, herbs, eye drops, creams, and over-the-counter medicines. Any problems you or family members have had with anesthetic medicines. Any blood disorders you have. Any surgeries you have had. Any medical conditions you have. Whether you are pregnant or may be pregnant. What are the risks? Generally, this is a safe procedure. However, problems may occur, including: Harm to a pregnant woman and her unborn baby. This test involves the use of radiation. Radiation exposure can be dangerous to a pregnant woman and her unborn baby. If you are pregnant, you generally should not have this  procedure done. Slight increase in the risk of cancer. This is because of the radiation involved in the test. What happens before the procedure? No preparation is needed for this procedure. What happens during the procedure? You will undress and remove any jewelry around your neck or chest. You will put on a hospital gown. Sticky electrodes will be placed on your chest. The electrodes will be connected to an electrocardiogram (ECG) machine to record a tracing of the electrical activity of your heart. A CT scanner will take pictures of your heart. During this time, you will be asked to lie still and hold your breath for 2-3 seconds while a picture of your heart is being taken. The procedure may vary among health care providers and hospitals. What happens after the procedure? You can get dressed. You can return to your normal activities. It is up to you to get the results of your test. Ask your health care provider, or the department that is doing the test, when your results will be ready. Summary A coronary calcium scan is an imaging test used to look for deposits of calcium and other fatty materials (plaques) in the inner lining of the blood vessels of the heart (coronary arteries). Generally, this is a safe procedure. Tell your health care provider if you are pregnant or may be pregnant. No preparation is needed for this procedure. A CT scanner will take pictures of your heart. You can return to your normal activities after the scan is done. This information is not intended to replace advice given to you by your health care provider. Make sure you discuss any questions you have with your health care provider. Document Released: 06/12/2008 Document Revised: 11/03/2016 Document Reviewed: 11/03/2016 Elsevier Interactive Patient Education  2017 [place]: At [facility], you and your health needs are our priority.  As part of our continuing mission to provide you with  exceptional heart care, we have created designated Provider Care Teams.  These Care Teams include your primary Cardiologist (physician) and Advanced Practice Providers (APPs -  Physician Assistants and Nurse Practitioners) who all work together to provide you with the care you need, when you need it.  Your next appointment:   6 month(s)  The format for your next appointment:   In Person  Provider:  Oswaldo Milian, MD   Signed, Donato Heinz, MD  08/01/2021 2:49 PM    [place]

## 2021-08-01 ENCOUNTER — Ambulatory Visit (INDEPENDENT_AMBULATORY_CARE_PROVIDER_SITE_OTHER): Payer: Medicare Other | Admitting: Cardiology

## 2021-08-01 ENCOUNTER — Encounter: Payer: Self-pay | Admitting: Cardiology

## 2021-08-01 ENCOUNTER — Other Ambulatory Visit: Payer: Self-pay

## 2021-08-01 VITALS — BP 132/54 | HR 93 | Ht 69.0 in | Wt 235.4 lb

## 2021-08-01 DIAGNOSIS — R0683 Snoring: Secondary | ICD-10-CM

## 2021-08-01 DIAGNOSIS — R400 Somnolence: Secondary | ICD-10-CM

## 2021-08-01 DIAGNOSIS — R0602 Shortness of breath: Secondary | ICD-10-CM | POA: Diagnosis not present

## 2021-08-01 NOTE — Patient Instructions (Signed)
Medication Instructions:  No Changes In Medications at this time.  *If you need a refill on your cardiac medications before your next appointment, please call your pharmacy*  Testing/Procedures: Your physician has recommended that you have a sleep study. This test records several body functions during sleep, including: brain activity, eye movement, oxygen and carbon dioxide blood levels, heart rate and rhythm, breathing rate and rhythm, the flow of air through your mouth and nose, snoring, body muscle movements, and chest and belly movement. SOMEONE WILL REACH OUT TO YOU TO SCHEDULE THIS   Your physician has recommended that you have a pulmonary function test. Pulmonary Function Tests are a group of tests that measure how well air moves in and out of your lungs.  Dr. Gardiner Rhyme has ordered a CT coronary calcium score. This test is done at [address]. This is $99 out of pocket.  Coronary CalciumScan A coronary calcium scan is an imaging test used to look for deposits of calcium and other fatty materials (plaques) in the inner lining of the blood vessels of the heart (coronary arteries). These deposits of calcium and plaques can partly clog and narrow the coronary arteries without producing any symptoms or warning signs. This puts a person at risk for a heart attack. This test can detect these deposits before symptoms develop. Tell a health care provider about: Any allergies you have. All medicines you are taking, including vitamins, herbs, eye drops, creams, and over-the-counter medicines. Any problems you or family members have had with anesthetic medicines. Any blood disorders you have. Any surgeries you have had. Any medical conditions you have. Whether you are pregnant or may be pregnant. What are the risks? Generally, this is a safe procedure. However, problems may occur, including: Harm to a pregnant woman and her unborn baby. This test involves the use of radiation.  Radiation exposure can be dangerous to a pregnant woman and her unborn baby. If you are pregnant, you generally should not have this procedure done. Slight increase in the risk of cancer. This is because of the radiation involved in the test. What happens before the procedure? No preparation is needed for this procedure. What happens during the procedure? You will undress and remove any jewelry around your neck or chest. You will put on a hospital gown. Sticky electrodes will be placed on your chest. The electrodes will be connected to an electrocardiogram (ECG) machine to record a tracing of the electrical activity of your heart. A CT scanner will take pictures of your heart. During this time, you will be asked to lie still and hold your breath for 2-3 seconds while a picture of your heart is being taken. The procedure may vary among health care providers and hospitals. What happens after the procedure? You can get dressed. You can return to your normal activities. It is up to you to get the results of your test. Ask your health care provider, or the department that is doing the test, when your results will be ready. Summary A coronary calcium scan is an imaging test used to look for deposits of calcium and other fatty materials (plaques) in the inner lining of the blood vessels of the heart (coronary arteries). Generally, this is a safe procedure. Tell your health care provider if you are pregnant or may be pregnant. No preparation is needed for this procedure. A CT scanner will take pictures of your heart. You can return to your normal activities after the scan is done.  This information is not intended to replace advice given to you by your health care provider. Make sure you discuss any questions you have with your health care provider. Document Released: 06/12/2008 Document Revised: 11/03/2016 Document Reviewed: 11/03/2016 Elsevier Interactive Patient Education  2017 [place]: At [facility], you and your health needs are our priority.  As part of our continuing mission to provide you with exceptional heart care, we have created designated Provider Care Teams.  These Care Teams include your primary Cardiologist (physician) and Advanced Practice Providers (APPs -  Physician Assistants and Nurse Practitioners) who all work together to provide you with the care you need, when you need it.  Your next appointment:   6 month(s)  The format for your next appointment:   In Person  Provider:   Oswaldo Milian, MD

## 2021-08-28 ENCOUNTER — Other Ambulatory Visit (HOSPITAL_COMMUNITY)
Admission: RE | Admit: 2021-08-28 | Discharge: 2021-08-28 | Disposition: A | Discharge status: Home / Self Care | Payer: Medicare Other | Source: Ambulatory Visit | Attending: Cardiology | Admitting: Cardiology

## 2021-08-30 ENCOUNTER — Other Ambulatory Visit: Payer: Self-pay

## 2021-08-30 ENCOUNTER — Encounter (HOSPITAL_COMMUNITY): Payer: Medicare Other

## 2021-10-03 ENCOUNTER — Other Ambulatory Visit: Payer: Medicare Other

## 2021-10-03 ENCOUNTER — Ambulatory Visit (INDEPENDENT_AMBULATORY_CARE_PROVIDER_SITE_OTHER): Payer: Medicare Other | Admitting: Gastroenterology

## 2021-10-03 ENCOUNTER — Encounter: Payer: Self-pay | Admitting: Gastroenterology

## 2021-10-03 VITALS — BP 142/76 | HR 76 | Ht 69.0 in | Wt 229.5 lb

## 2021-10-03 DIAGNOSIS — R6881 Early satiety: Secondary | ICD-10-CM | POA: Diagnosis not present

## 2021-10-03 DIAGNOSIS — R634 Abnormal weight loss: Secondary | ICD-10-CM

## 2021-10-03 DIAGNOSIS — D509 Iron deficiency anemia, unspecified: Secondary | ICD-10-CM

## 2021-10-03 NOTE — Progress Notes (Signed)
[place],  [address]   CLINIC:  Medical Oncology/Hematology  Patient Care Team: Caryl Bis, MD as PCP - General (Family Medicine) Derek Jack, MD as Medical Oncologist (Hematology)  CHIEF COMPLAINTS/PURPOSE OF CONSULTATION:  Evaluation of microcytic anemia  HISTORY OF PRESENTING ILLNESS:  Shannon Figueroa 70 y.o. female is here because of evaluation of anemia, at the request of Dr. Quillian Quince.  Today she reports feeling good, and she is accompanied by her husband. Starting in January she began experienced fatigue, light-headedness, SOB, dizziness, and unusual ice cravings. She received 2 units of Feraheme and reports her symptoms have improved. She has not required blood transfusions. She denies hematochezia, hematuria, and black stools. She is not currently taking iron tablets. She reports a history of kidney stones, and she denies history of stomach ulcers. She has lost 20 pounds since March due to a lack of appetite, however she reports that her appetite had improved. She denies fevers, night sweats, and leg swellings. She currently lives at home with her husband. She works with payroll within an office. She denies family history of anemia. Her maternal grandmother had an unspecified cancer, and her brother passed from anal cancer at 6.    MEDICAL HISTORY:  Past Medical History:  Diagnosis Date   Allergy    GERD (gastroesophageal reflux disease)    Graves disease    History of colon polyps    Thyroid disease     SURGICAL HISTORY: Past Surgical History:  Procedure Laterality Date   ABDOMINAL HYSTERECTOMY     BACK SURGERY     BREAST EXCISIONAL BIOPSY Right    benign   COLONOSCOPY  2016   ovaries removed  2010   POLYPECTOMY     THYROIDECTOMY      SOCIAL HISTORY: Social History   Socioeconomic History   Marital status: Married    Spouse name: Not on file   Number of children: Not on file   Years of education: Not on  file   Highest education level: Not on file  Occupational History   Not on file  Tobacco Use   Smoking status: Never   Smokeless tobacco: Never  Vaping Use   Vaping Use: Never used  Substance and Sexual Activity   Alcohol use: No    Alcohol/week: 0.0 standard drinks   Drug use: No   Sexual activity: Not on file  Other Topics Concern   Not on file  Social History Narrative   Not on file   Social Determinants of Health   Financial Resource Strain: Not on file  Food Insecurity: Not on file  Transportation Needs: Not on file  Physical Activity: Not on file  Stress: Not on file  Social Connections: Not on file  Intimate Partner Violence: Not on file    FAMILY HISTORY: Family History  Problem Relation Age of Onset   Cancer Brother 68       anal cancer-   Rectal cancer Brother 72       started as anal cancer   Colon cancer Maternal Grandmother        unsure if styarted in colon- had metastasized all over her    Esophageal cancer Neg Hx    Colon polyps Neg Hx     ALLERGIES:  is allergic to clindamycin/lincomycin, codeine, dexlansoprazole, esomeprazole magnesium, levofloxacin, rabeprazole sodium, valium [diazepam], and penicillins.  MEDICATIONS:  Current Outpatient Medications  Medication Sig Dispense Refill   fluticasone (FLONASE) 50  MCG/ACT nasal spray Place 1 spray into both nostrils daily.     gabapentin (NEURONTIN) 300 MG capsule Take 300 mg by mouth at bedtime.     levothyroxine (SYNTHROID) 88 MCG tablet Take 88 mcg by mouth in the morning.     pantoprazole (PROTONIX) 40 MG tablet Take 40 mg by mouth daily.     timolol (TIMOPTIC) 0.5 % ophthalmic solution SMARTSIG:In Eye(s)     zolpidem (AMBIEN) 10 MG tablet Take 10 mg by mouth at bedtime.     No current facility-administered medications for this visit.    REVIEW OF SYSTEMS:   Review of Systems  Constitutional:  Negative for appetite change (75% improved), fatigue (75% improved), fever and unexpected weight  change.  Respiratory:  Negative for shortness of breath (improved).   Cardiovascular:  Negative for leg swelling.  Gastrointestinal:  Negative for blood in stool.  Genitourinary:  Negative for hematuria.   Neurological:  Negative for dizziness (improved) and light-headedness (improved).  All other systems reviewed and are negative.   PHYSICAL EXAMINATION: ECOG PERFORMANCE STATUS: 1 - Symptomatic but completely ambulatory  Vitals:   10/04/21 1228  BP: (!) 142/82  Pulse: 74  Resp: 17  Temp: (!) 97.5 F (36.4 C)  SpO2: 96%   Filed Weights   10/04/21 1228  Weight: 229 lb 4.5 oz (104 kg)   Physical Exam Vitals reviewed.  Constitutional:      Appearance: Normal appearance.  Cardiovascular:     Rate and Rhythm: Normal rate and regular rhythm.     Pulses: Normal pulses.     Heart sounds: Normal heart sounds.  Pulmonary:     Effort: Pulmonary effort is normal.     Breath sounds: Normal breath sounds.  Abdominal:     Palpations: Abdomen is soft. There is no hepatomegaly, splenomegaly or mass.     Tenderness: There is no abdominal tenderness.  Musculoskeletal:     Right lower leg: No edema.     Left lower leg: No edema.  Lymphadenopathy:     Cervical: No cervical adenopathy.     Right cervical: No superficial cervical adenopathy.    Left cervical: No superficial cervical adenopathy.     Upper Body:     Right upper body: No supraclavicular, axillary or pectoral adenopathy.     Left upper body: No supraclavicular, axillary or pectoral adenopathy.  Neurological:     General: No focal deficit present.     Mental Status: She is alert and oriented to person, place, and time.  Psychiatric:        Mood and Affect: Mood normal.        Behavior: Behavior normal.     LABORATORY DATA:  I have reviewed the data as listed Recent Results (from the past 2160 hour(s))  IgA     Status: None   Collection Time: 10/03/21  9:43 AM  Result Value Ref Range   Immunoglobulin A 119 70 - 320  mg/dL    RADIOGRAPHIC STUDIES: I have personally reviewed the radiological images as listed and agreed with the findings in the report. No results found.  ASSESSMENT:  Severe microcytic anemia: - Reported fatigue, shortness of breath and dizziness for the past few months. - Colonoscopy on 02/26/2021-diverticulosis in the right and left colon, 2 polyps in the descending and transverse colon, tubular adenomas. - CBC on 08/26/2021 with hemoglobin 5.5.  Ferritin was 3.  MCV was 65.  Creatinine was 0.81. - She reports that she received 2 infusions of  Feraheme on 08/29/2021 and 09/05/2021. - CBC on 09/04/2021 with hemoglobin 6.3, MCV 73. - She also received 1 unit of PRBC during the week of 09/09/2021. - She denies any bleeding per rectum or melena.  She is not on iron supplements.  Ice pica has improved after the iron infusions.  No prior history of blood transfusions. - She reports to be on antacids for the past several years for hiatal hernia and acid reflux.  Family/social history: - Lives at home with her husband.  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  Non-smoker.  No family history of anemia.  Maternal grandmother died of metastatic cancer.  Brother died of anal cancer.   PLAN:  Severe iron deficiency anemia: - Her severe anemia symptoms have already improved.  No ice pica at this time. - Because of severe iron deficiency unknown at this time. - We discussed differential diagnosis including decreased absorption versus deficiency from blood loss. - She has EGD scheduled on 10/09/2021. - We will repeat her CBC today.  Will check ferritin and iron panel.  We will check for other nutritional deficiencies including Q59, folic acid, methylmalonic acid, copper levels.  We will also check SPEP, LDH and reticulocyte count. - RTC 3 weeks for follow-up.   All questions were answered. The patient knows to call the clinic with any problems, questions or concerns.  Derek Jack, MD 10/04/21 1:07  PM  [facility] [phone]   I, Thana Ates, am acting as a scribe for Dr. Derek Jack.  I, Derek Jack MD, have reviewed the above documentation for accuracy and completeness, and I agree with the above.

## 2021-10-03 NOTE — Patient Instructions (Signed)
Your provider has requested that you go to the basement level for lab work before leaving today. Press "B" on the elevator. The lab is located at the first door on the left as you exit the elevator.  You have been scheduled for an endoscopy. Please follow written instructions given to you at your visit today. If you use inhalers (even only as needed), please bring them with you on the day of your procedure.  Due to recent changes in healthcare laws, you may see the results of your imaging and laboratory studies on MyChart before your provider has had a chance to review them.  We understand that in some cases there may be results that are confusing or concerning to you. Not all laboratory results come back in the same time frame and the provider may be waiting for multiple results in order to interpret others.  Please give [place] 48 hours in order for your provider to thoroughly review all the results before contacting the office for clarification of your results.   The [facility] providers would like to encourage you to use [facility] to communicate with providers for non-urgent requests or questions.  Due to long hold times on the telephone, sending your provider a message by [facility] may be a faster and more efficient way to get a response.  Please allow 48 business hours for a response.  Please remember that this is for non-urgent requests.   Normal BMI (Body Mass Index- based on height and weight) is between 23 and 30. Your BMI today is Body mass index is 33.89 kg/m. Marland Kitchen Please consider follow up  regarding your BMI with your Primary Care Provider.  Thank you for choosing me and [facility].  Pricilla Riffle. Dagoberto Ligas., MD., Marval Regal   .

## 2021-10-03 NOTE — Progress Notes (Addendum)
    History of Present Illness: This is a 70 year old recently diagnosed with iron deficiency anemia.  She is accompanied by her husband.  She noted progressive weakness and fatigue. A hemoglobin of 5.5 with an MCV of 65 was noted on August 29th.  Ferritin 3, iron saturation 3 and iron 11.  CMP, TSH and ESR were normal.  She received 1 unit of blood and 2 iron infusions. Her hemoglobin on October 3 was 11.9.  Her weakness and fatigue have resolved.  She relates a long history of postprandial dyspeptic symptoms that have worsened over the past several months.  She describes early satiety and fullness after meals often associated with intestinal gas.  She has noted a 20 pound weight loss over the past several months.  She takes pantoprazole in the morning and occasionally takes omeprazole in the afternoons.  She does not feel that pantoprazole or omeprazole are helping her symptoms.  She does not donate blood.  She denies constipation, diarrhea, change in stool caliber, melena, hematochezia, nausea, vomiting, dysphagia, chest pain.  Colonoscopy 02/2021 for polyp surveillance - Two 7 to 8 mm polyps in the descending colon and in the transverse colon, removed with a cold snare. Resected and retrieved. - Mild diverticulosis in the right colon. - Moderate diverticulosis in the left colon. - The examination was otherwise normal on direct and retroflexion views.   Current Medications, Allergies, Past Medical History, Past Surgical History, Family History and Social History were reviewed in [employer] record.   Physical Exam: General: Well developed, well nourished, no acute distress Head: Normocephalic and atraumatic Eyes: Sclerae anicteric, EOMI Ears: Normal auditory acuity Mouth: Not examined, mask on during Covid-19 pandemic Lungs: Clear throughout to auscultation Heart: Regular rate and rhythm; no murmurs, rubs or bruits Abdomen: Soft, non tender and non distended. No masses,  hepatosplenomegaly or hernias noted. Normal Bowel sounds Rectal: Not done Musculoskeletal: Symmetrical with no gross deformities  Pulses:  Normal pulses noted Extremities: No clubbing, cyanosis, edema or deformities noted Neurological: Alert oriented x 4, grossly nonfocal Psychological:  Alert and cooperative. Normal mood and affect   Assessment and Recommendations:  Newly diagnosed iron deficiency anemia.  Long-term postprandial dyspepsia, early satiety, epigastric fullness, weight loss which has worsened over the past several months.  Continue pantoprazole 40 mg every morning and omeprazole 20 mg every afternoon as needed.  Obtain IgA and tTG today.  Schedule EGD.  The risks (including bleeding, perforation, infection, missed lesions, medication reactions and possible hospitalization or surgery if complications occur), benefits, and alternatives to endoscopy with possible biopsy and possible dilation were discussed with the patient and they consent to proceed.  The day prior to EGD she is advised to have a light, low residue breakfast and then clear liquids up until n.p.o. for EGD.  If no source for iron deficiency is noted consider proceeding with CT AP and VCE. Personal history of adenomatous colon polyps.  Surveillance colonoscopy is recommended in March 2029.

## 2021-10-04 ENCOUNTER — Other Ambulatory Visit: Payer: Self-pay

## 2021-10-04 ENCOUNTER — Inpatient Hospital Stay (HOSPITAL_COMMUNITY): Payer: Medicare Other | Attending: Hematology | Admitting: Hematology

## 2021-10-04 ENCOUNTER — Inpatient Hospital Stay (HOSPITAL_COMMUNITY): Payer: Medicare Other

## 2021-10-04 ENCOUNTER — Encounter (HOSPITAL_COMMUNITY): Payer: Self-pay | Admitting: Hematology

## 2021-10-04 VITALS — BP 142/82 | HR 74 | Temp 97.5°F | Resp 17 | Ht 69.0 in | Wt 229.3 lb

## 2021-10-04 DIAGNOSIS — Z86010 Personal history of colonic polyps: Secondary | ICD-10-CM | POA: Diagnosis not present

## 2021-10-04 DIAGNOSIS — Z800 Family history of malignant neoplasm of digestive organs: Secondary | ICD-10-CM

## 2021-10-04 DIAGNOSIS — D509 Iron deficiency anemia, unspecified: Secondary | ICD-10-CM | POA: Insufficient documentation

## 2021-10-04 DIAGNOSIS — R630 Anorexia: Secondary | ICD-10-CM | POA: Diagnosis not present

## 2021-10-04 DIAGNOSIS — Z23 Encounter for immunization: Secondary | ICD-10-CM | POA: Diagnosis not present

## 2021-10-04 DIAGNOSIS — D649 Anemia, unspecified: Secondary | ICD-10-CM

## 2021-10-04 DIAGNOSIS — R634 Abnormal weight loss: Secondary | ICD-10-CM | POA: Diagnosis not present

## 2021-10-04 LAB — CBC WITH DIFFERENTIAL/PLATELET
Abs Immature Granulocytes: 0.01 10*3/uL (ref 0.00–0.07)
Basophils Absolute: 0 10*3/uL (ref 0.0–0.1)
Basophils Relative: 1 %
Eosinophils Absolute: 0 10*3/uL (ref 0.0–0.5)
Eosinophils Relative: 0 %
HCT: 39.7 % (ref 36.0–46.0)
Hemoglobin: 11.9 g/dL — ABNORMAL LOW (ref 12.0–15.0)
Immature Granulocytes: 0 %
Lymphocytes Relative: 17 %
Lymphs Abs: 1.1 10*3/uL (ref 0.7–4.0)
MCH: 26.4 pg (ref 26.0–34.0)
MCHC: 30 g/dL (ref 30.0–36.0)
MCV: 88.2 fL (ref 80.0–100.0)
Monocytes Absolute: 0.5 10*3/uL (ref 0.1–1.0)
Monocytes Relative: 8 %
Neutro Abs: 4.9 10*3/uL (ref 1.7–7.7)
Neutrophils Relative %: 74 %
Platelets: 214 10*3/uL (ref 150–400)
RBC: 4.5 MIL/uL (ref 3.87–5.11)
RDW: 23 % — ABNORMAL HIGH (ref 11.5–15.5)
WBC: 6.6 10*3/uL (ref 4.0–10.5)
nRBC: 0 % (ref 0.0–0.2)

## 2021-10-04 LAB — RETICULOCYTES
Immature Retic Fract: 25.2 % — ABNORMAL HIGH (ref 2.3–15.9)
RBC.: 4.6 MIL/uL (ref 3.87–5.11)
Retic Count, Absolute: 58.4 10*3/uL (ref 19.0–186.0)
Retic Ct Pct: 1.3 % (ref 0.4–3.1)

## 2021-10-04 LAB — IRON AND TIBC
Iron: 213 ug/dL — ABNORMAL HIGH (ref 28–170)
Saturation Ratios: 53 % — ABNORMAL HIGH (ref 10.4–31.8)
TIBC: 405 ug/dL (ref 250–450)
UIBC: 192 ug/dL

## 2021-10-04 LAB — LACTATE DEHYDROGENASE: LDH: 150 U/L (ref 98–192)

## 2021-10-04 LAB — FOLATE: Folate: 33.7 ng/mL (ref >5.9)

## 2021-10-04 LAB — VITAMIN B12: Vitamin B-12: 171 pg/mL — ABNORMAL LOW (ref 180–914)

## 2021-10-04 LAB — IGA: Immunoglobulin A: 119 mg/dL (ref 70–320)

## 2021-10-04 LAB — FERRITIN: Ferritin: 19 ng/mL (ref 11–307)

## 2021-10-04 LAB — TISSUE TRANSGLUTAMINASE, IGA: (tTG) Ab, IgA: <1 U/mL

## 2021-10-04 MED ORDER — INFLUENZA VAC A&B SA ADJ QUAD 0.5 ML IM PRSY
0.5000 mL | PREFILLED_SYRINGE | Freq: Once | INTRAMUSCULAR | Status: AC
  Administered 2021-10-04: 0.5 mL via INTRAMUSCULAR
  Filled 2021-10-04: qty 0.5

## 2021-10-04 NOTE — Progress Notes (Signed)
Patient to receive flu shot today. Denies allergy or any previous reaction to flu shot. Patient received flu shot via IM injection in R Deltoid. Patient tolerated well without complaints. Patient discharged from clinic in satisfactory condition, ambulatory, with husband.

## 2021-10-04 NOTE — Patient Instructions (Addendum)
[place] at [facility] Discharge Instructions  You were seen and examined today by Dr. Delton Coombes. Dr. Delton Coombes is a hematologist, meaning that he specializes in blood disorders. Dr. Delton Coombes discussed your past medical history, family history of cancer/blood disorders, and the events that led to you being here today.  You were referred to Dr. Delton Coombes due to anemia. Dr. Delton Coombes has recommended additional lab work today in an attempt to identify the cause of your anemia.  Follow-up as scheduled.   Thank you for choosing [place] at [facility] to provide your oncology and hematology care.  To afford each patient quality time with our provider, please arrive at least 15 minutes before your scheduled appointment time.   If you have a lab appointment with the [facility] please come in thru the Main Entrance and check in at the main information desk.  You need to re-schedule your appointment should you arrive 10 or more minutes late.  We strive to give you quality time with our providers, and arriving late affects you and other patients whose appointments are after yours.  Also, if you no show three or more times for appointments you may be dismissed from the clinic at the providers discretion.     Again, thank you for choosing [facility].  Our hope is that these requests will decrease the amount of time that you wait before being seen by our physicians.       _____________________________________________________________  Should you have questions after your visit to [facility], please contact our office at [phone] and follow the prompts.  Our office hours are 8:00 a.m. and 4:30 p.m. Monday - Friday.  Please note that voicemails left after 4:00 p.m. may not be returned until the following business day.  We are closed weekends and major holidays.  You do have access to a nurse 24-7, just call the main  number to the clinic [phone] and do not press any options, hold on the line and a nurse will answer the phone.    For prescription refill requests, have your pharmacy contact our office and allow 72 hours.    Due to Covid, you will need to wear a mask upon entering the hospital. If you do not have a mask, a mask will be given to you at the Main Entrance upon arrival. For doctor visits, patients may have 1 support person age 68 or older with them. For treatment visits, patients can not have anyone with them due to social distancing guidelines and our immunocompromised population.

## 2021-10-06 LAB — COPPER, SERUM: Copper: 116 ug/dL (ref 80–158)

## 2021-10-07 LAB — METHYLMALONIC ACID, SERUM: Methylmalonic Acid, Quantitative: 279 nmol/L (ref 0–378)

## 2021-10-08 LAB — PROTEIN ELECTROPHORESIS, SERUM
A/G Ratio: 1.4 (ref 0.7–1.7)
Albumin ELP: 3.7 g/dL (ref 2.9–4.4)
Alpha-1-Globulin: 0.2 g/dL (ref 0.0–0.4)
Alpha-2-Globulin: 0.7 g/dL (ref 0.4–1.0)
Beta Globulin: 1 g/dL (ref 0.7–1.3)
Gamma Globulin: 0.7 g/dL (ref 0.4–1.8)
Globulin, Total: 2.6 g/dL (ref 2.2–3.9)
Total Protein ELP: 6.3 g/dL (ref 6.0–8.5)

## 2021-10-09 ENCOUNTER — Encounter: Payer: Self-pay | Admitting: Gastroenterology

## 2021-10-09 ENCOUNTER — Other Ambulatory Visit: Payer: Self-pay

## 2021-10-09 ENCOUNTER — Ambulatory Visit (AMBULATORY_SURGERY_CENTER): Payer: Medicare Other | Admitting: Gastroenterology

## 2021-10-09 VITALS — BP 130/64 | HR 53 | Temp 97.3°F | Resp 16 | Ht 69.0 in | Wt 229.0 lb

## 2021-10-09 DIAGNOSIS — R1013 Epigastric pain: Secondary | ICD-10-CM

## 2021-10-09 DIAGNOSIS — R6881 Early satiety: Secondary | ICD-10-CM

## 2021-10-09 DIAGNOSIS — D509 Iron deficiency anemia, unspecified: Secondary | ICD-10-CM

## 2021-10-09 DIAGNOSIS — K317 Polyp of stomach and duodenum: Secondary | ICD-10-CM

## 2021-10-09 MED ORDER — SODIUM CHLORIDE 0.9 % IV SOLN
500.0000 mL | INTRAVENOUS | Status: DC
Start: 2021-10-09 — End: 2021-10-09

## 2021-10-09 NOTE — Progress Notes (Signed)
Data will not transfer from monitor, vs being posted manually.   

## 2021-10-09 NOTE — Progress Notes (Signed)
See 10/03/2021 H&P, no changes.

## 2021-10-09 NOTE — Progress Notes (Signed)
Called to room to assist during endoscopic procedure.  Patient ID and intended procedure confirmed with present staff. Received instructions for my participation in the procedure from the performing physician.  

## 2021-10-09 NOTE — Progress Notes (Signed)
Report given to PACU, vss 

## 2021-10-09 NOTE — Progress Notes (Signed)
0906 Robinul 0.1 mg IV given due large amount of secretions upon assessment.  MD made aware, vss

## 2021-10-09 NOTE — Op Note (Signed)
[place] Patient Name: Shannon Figueroa Procedure Date: 10/09/2021 9:07 AM MRN: [id number] Endoscopist: Ladene Artist , MD Age: 70 Referring MD:  Date of Birth: February 12, 1951 Gender: Female Account #: [id number] Procedure:                Upper GI endoscopy Indications:              Unexplained iron deficiency anemia, Dyspepsia,                            Early satiety Medicines:                Monitored Anesthesia Care Procedure:                Pre-Anesthesia Assessment:                           - Prior to the procedure, a History and Physical                            was performed, and patient medications and                            allergies were reviewed. The patient's tolerance of                            previous anesthesia was also reviewed. The risks                            and benefits of the procedure and the sedation                            options and risks were discussed with the patient.                            All questions were answered, and informed consent                            was obtained. Prior Anticoagulants: The patient has                            taken no previous anticoagulant or antiplatelet                            agents. ASA Grade Assessment: II - A patient with                            mild systemic disease. After reviewing the risks                            and benefits, the patient was deemed in                            satisfactory condition to undergo the procedure.  After obtaining informed consent, the endoscope was                            passed under direct vision. Throughout the                            procedure, the patient's blood pressure, pulse, and                            oxygen saturations were monitored continuously. The                            Endoscope was introduced through the mouth, and                            advanced to the second part of duodenum. The  upper                            GI endoscopy was accomplished without difficulty.                            The patient tolerated the procedure well. Scope In: Scope Out: Findings:                 The examined esophagus was normal.                           A single 8 mm sessile polyp with no bleeding and no                            stigmata of recent bleeding was found on the                            anterior wall of the stomach. The polyp was removed                            with a cold snare. Resection and retrieval were                            complete.                           A medium-sized hiatal hernia was present.                           A few localized erosions with no bleeding and no                            stigmata of recent bleeding were found in the                            gastric fundus.                           The exam  of the stomach was otherwise normal.                           The duodenal bulb and second portion of the                            duodenum were normal. Complications:            No immediate complications. Estimated Blood Loss:     Estimated blood loss was minimal. Impression:               - Normal esophagus.                           - A single gastric polyp. Resected and retrieved.                           - Medium-sized hiatal hernia.                           - Gastric erosions associated with the hiatal                            hernia with no bleeding and no stigmata of recent                            bleeding.                           - Normal duodenal bulb and second portion of the                            duodenum. Recommendation:           - Patient has a contact number available for                            emergencies. The signs and symptoms of potential                            delayed complications were discussed with the                            patient. Return to normal activities tomorrow.                             Written discharge instructions were provided to the                            patient.                           - Resume previous diet.                           - Follow antireflux measures long term.                           -  Continue present medications including                            pantoprazole 40 mg po qd.                           - Await pathology results.                           - No aspirin, ibuprofen, naproxen, or other                            non-steroidal anti-inflammatory drugs for 2 weeks                            after polyp removal.                           - Return to GI office in 6 weeks. Ladene Artist, MD 10/09/2021 9:28:29 AM This report has been signed electronically.

## 2021-10-09 NOTE — Patient Instructions (Signed)
Handout given for Hiatal Hernia. Await pathology results. No aspirin, ibuprofen,naproxen or other NSAIDS for 2 weeks, you may use tylenol if needed.   YOU HAD AN ENDOSCOPIC PROCEDURE TODAY AT [facility]:   Refer to the procedure report that was given to you for any specific questions about what was found during the examination.  If the procedure report does not answer your questions, please call your gastroenterologist to clarify.  If you requested that your care partner not be given the details of your procedure findings, then the procedure report has been included in a sealed envelope for you to review at your convenience later.  YOU SHOULD EXPECT: Some feelings of bloating in the abdomen. Passage of more gas than usual.  Walking can help get rid of the air that was put into your GI tract during the procedure and reduce the bloating. If you had a lower endoscopy (such as a colonoscopy or flexible sigmoidoscopy) you may notice spotting of blood in your stool or on the toilet paper. If you underwent a bowel prep for your procedure, you may not have a normal bowel movement for a few days.  Please Note:  You might notice some irritation and congestion in your nose or some drainage.  This is from the oxygen used during your procedure.  There is no need for concern and it should clear up in a day or so.  SYMPTOMS TO REPORT IMMEDIATELY:  Following upper endoscopy (EGD)  Vomiting of blood or coffee ground material  New chest pain or pain under the shoulder blades  Painful or persistently difficult swallowing  New shortness of breath  Fever of 100F or higher  Black, tarry-looking stools  For urgent or emergent issues, a gastroenterologist can be reached at any hour by calling [phone]. Do not use MyChart messaging for urgent concerns.    DIET:  We do recommend a small meal at first, but then you may proceed to your regular diet.  Drink plenty of fluids but you should avoid  alcoholic beverages for 24 hours.  ACTIVITY:  You should plan to take it easy for the rest of today and you should NOT DRIVE or use heavy machinery until tomorrow (because of the sedation medicines used during the test).    FOLLOW UP: Our staff will call the number listed on your records 48-72 hours following your procedure to check on you and address any questions or concerns that you may have regarding the information given to you following your procedure. If we do not reach you, we will leave a message.  We will attempt to reach you two times.  During this call, we will ask if you have developed any symptoms of COVID 19. If you develop any symptoms (ie: fever, flu-like symptoms, shortness of breath, cough etc.) before then, please call [phone].  If you test positive for Covid 19 in the 2 weeks post procedure, please call and report this information to [place].    If any biopsies were taken you will be contacted by phone or by letter within the next 1-3 weeks.  Please call us at [phone] if you have not heard about the biopsies in 3 weeks.    SIGNATURES/CONFIDENTIALITY: You and/or your care partner have signed paperwork which will be entered into your electronic medical record.  These signatures attest to the fact that that the information above on your After Visit Summary has been reviewed and is understood.  Full responsibility of the confidentiality  of this discharge information lies with you and/or your care-partner.  

## 2021-10-11 ENCOUNTER — Telehealth: Payer: Self-pay | Admitting: *Deleted

## 2021-10-11 NOTE — Telephone Encounter (Signed)
Message left

## 2021-10-11 NOTE — Telephone Encounter (Signed)
  Follow up Call-  Call back number 10/09/2021 02/26/2021  Post procedure Call Back phone  # [phone]  Permission to leave phone message Yes Yes  Some recent data might be hidden     Patient questions:  Do you have a fever, pain , or abdominal swelling? No. Pain Score  0 *  Have you tolerated food without any problems? Yes.    Have you been able to return to your normal activities? Yes.    Do you have any questions about your discharge instructions: Diet   No. Medications  No. Follow up visit  No.  Do you have questions or concerns about your Care? No.  Actions: * If pain score is 4 or above: No action needed, pain <4.  Have you developed a fever since your procedure? no  2.   Have you had an respiratory symptoms (SOB or cough) since your procedure? no  3.   Have you tested positive for COVID 19 since your procedure no  4.   Have you had any family members/close contacts diagnosed with the COVID 19 since your procedure?  no   If yes to any of these questions please route to Joylene John, RN and Joella Prince, RN

## 2021-10-22 ENCOUNTER — Encounter: Payer: Self-pay | Admitting: Gastroenterology

## 2021-10-28 NOTE — Progress Notes (Signed)
[place], [place] [address]   CLINIC:  Medical Oncology/Hematology  PCP:  Shannon Bis, MD [address] [place] [address]  [phone]  REASON FOR VISIT:  Follow-up for microcytic anemia  PRIOR THERAPY: none  CURRENT THERAPY: surveillance  INTERVAL HISTORY:  [name], a 69 y.o. female, returns for routine follow-up for her microcytic anemia. Shannon Figueroa was last seen on 10/04/2021.  Today she reports feeling good. She has started taking a multivitamin with 18 mg of iron, and she is tolerating it well. She denies current bleeding and black stools. She reports stable chronic constipation.  REVIEW OF SYSTEMS:  Review of Systems  Constitutional:  Negative for appetite change and fatigue (75%).  HENT:   Negative for nosebleeds.   Respiratory:  Negative for hemoptysis.   Gastrointestinal:  Positive for constipation. Negative for blood in stool.  Genitourinary:  Negative for hematuria and vaginal bleeding.   Hematological:  Does not bruise/bleed easily.  All other systems reviewed and are negative.  PAST MEDICAL/SURGICAL HISTORY:  Past Medical History:  Diagnosis Date   Allergy    Anemia    GERD (gastroesophageal reflux disease)    Graves disease    History of colon polyps    Thyroid disease    Past Surgical History:  Procedure Laterality Date   ABDOMINAL HYSTERECTOMY     BACK SURGERY     BREAST EXCISIONAL BIOPSY Right    benign   COLONOSCOPY  2016   ovaries removed  2010   POLYPECTOMY     THYROIDECTOMY      SOCIAL HISTORY:  Social History   Socioeconomic History   Marital status: Married    Spouse name: Not on file   Number of children: Not on file   Years of education: Not on file   Highest education level: Not on file  Occupational History   Not on file  Tobacco Use   Smoking status: Never   Smokeless tobacco: Never  Vaping Use   Vaping Use: Never used  Substance and Sexual Activity   Alcohol use: No     Alcohol/week: 0.0 standard drinks   Drug use: No   Sexual activity: Not on file  Other Topics Concern   Not on file  Social History Narrative   Not on file   Social Determinants of Health   Financial Resource Strain: Not on file  Food Insecurity: Not on file  Transportation Needs: Not on file  Physical Activity: Not on file  Stress: Not on file  Social Connections: Not on file  Intimate Partner Violence: Not on file    FAMILY HISTORY:  Family History  Problem Relation Age of Onset   Cancer Brother 79       anal cancer-   Rectal cancer Brother 40       started as anal cancer   Colon cancer Maternal Grandmother        unsure if styarted in colon- had metastasized all over her    Esophageal cancer Neg Hx    Colon polyps Neg Hx     CURRENT MEDICATIONS:  Current Outpatient Medications  Medication Sig Dispense Refill   fluticasone (FLONASE) 50 MCG/ACT nasal spray Place 1 spray into both nostrils daily.     gabapentin (NEURONTIN) 300 MG capsule Take 300 mg by mouth at bedtime.     levothyroxine (SYNTHROID) 88 MCG tablet Take 88 mcg by mouth in the morning.  pantoprazole (PROTONIX) 40 MG tablet Take 40 mg by mouth daily.     timolol (TIMOPTIC) 0.5 % ophthalmic solution SMARTSIG:In Eye(s)     zolpidem (AMBIEN) 10 MG tablet Take 10 mg by mouth at bedtime. (Patient not taking: Reported on 10/09/2021)     No current facility-administered medications for this visit.    ALLERGIES:  Allergies  Allergen Reactions   Clindamycin/Lincomycin Other (See Comments)   Codeine      GI Upset   Dexlansoprazole Diarrhea   Esomeprazole Magnesium Diarrhea    nexium    Levofloxacin Other (See Comments)   Rabeprazole Sodium     REACTION: Headaches   Valium [Diazepam] Other (See Comments)   Penicillins Rash    PHYSICAL EXAM:  Performance status (ECOG): 1 - Symptomatic but completely ambulatory  There were no vitals filed for this visit. Wt Readings from Last 3 Encounters:   10/09/21 229 lb (103.9 kg)  10/04/21 229 lb 4.5 oz (104 kg)  10/03/21 229 lb 8 oz (104.1 kg)   Physical Exam Vitals reviewed.  Constitutional:      Appearance: Normal appearance.  Cardiovascular:     Rate and Rhythm: Normal rate and regular rhythm.     Pulses: Normal pulses.     Heart sounds: Normal heart sounds.  Pulmonary:     Effort: Pulmonary effort is normal.     Breath sounds: Normal breath sounds.  Neurological:     General: No focal deficit present.     Mental Status: She is alert and oriented to person, place, and time.  Psychiatric:        Mood and Affect: Mood normal.        Behavior: Behavior normal.    LABORATORY DATA:  I have reviewed the labs as listed.  CBC Latest Ref Rng & Units 10/04/2021 02/05/2017  WBC 4.0 - 10.5 K/uL 6.6 6.4  Hemoglobin 12.0 - 15.0 g/dL 11.9(L) 13.3  Hematocrit 36.0 - 46.0 % 39.7 40.0  Platelets 150 - 400 K/uL 214 194.0   CMP Latest Ref Rng & Units 02/05/2017  Glucose 70 - 99 mg/dL 137(H)  BUN 6 - 23 mg/dL 11  Creatinine 0.40 - 1.20 mg/dL 1.02  Sodium 135 - 145 mEq/L 142  Potassium 3.5 - 5.1 mEq/L 3.9  Chloride 96 - 112 mEq/L 106  CO2 19 - 32 mEq/L 29  Calcium 8.4 - 10.5 mg/dL 9.3  Total Protein 6.0 - 8.3 g/dL 6.8  Total Bilirubin 0.2 - 1.2 mg/dL 0.7  Alkaline Phos 39 - 117 U/L 79  AST 0 - 37 U/L 14  ALT 0 - 35 U/L 12      Component Value Date/Time   RBC 4.50 10/04/2021 1254   RBC 4.60 10/04/2021 1254   MCV 88.2 10/04/2021 1254   MCH 26.4 10/04/2021 1254   MCHC 30.0 10/04/2021 1254   RDW 23.0 (H) 10/04/2021 1254   LYMPHSABS 1.1 10/04/2021 1254   MONOABS 0.5 10/04/2021 1254   EOSABS 0.0 10/04/2021 1254   BASOSABS 0.0 10/04/2021 1254    DIAGNOSTIC IMAGING:  I have independently reviewed the scans and discussed with the patient. No results found.   ASSESSMENT:  Severe microcytic anemia: - Reported fatigue, shortness of breath and dizziness for the past few months. - Colonoscopy on 02/26/2021-diverticulosis in the right  and left colon, 2 polyps in the descending and transverse colon, tubular adenomas. - CBC on 08/26/2021 with hemoglobin 5.5.  Ferritin was 3.  MCV was 65.  Creatinine was 0.81. - She  reports that she received 2 infusions of Feraheme on 08/29/2021 and 09/05/2021. - CBC on 09/04/2021 with hemoglobin 6.3, MCV 73. - She also received 1 unit of PRBC during the week of 09/09/2021. - She denies any bleeding per rectum or melena.  She is not on iron supplements.  Ice pica has improved after the iron infusions.  No prior history of blood transfusions. - She reports to be on antacids for the past several years for hiatal hernia and acid reflux.   Family/social history: - Lives at home with her husband.  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  Non-smoker.  No family history of anemia.  Maternal grandmother died of metastatic cancer.  Brother died of anal cancer.   PLAN:  Severe iron deficiency anemia: - EGD on 10/09/2021 shows normal esophagus, hyperplastic gastric polyp resected.  Medium sized hiatal hernia.  Gastric erosion associated with hiatal hernia with no bleeding.  Normal duodenal bulb and second portion. - Reviewed labs from 10/04/2021.  SPEP is negative.  Hemoglobin was 11.9.  Ferritin was 19. - She is taking multivitamin with 18 mg iron in it.  She does not have any worsening of constipation from it. - Recommend starting iron tablet once daily.  If she cannot tolerate it due to constipation, she may take it once every other day with stool softener. - She does not require any parenteral iron therapy at this time.  RTC 12 weeks with repeat labs.  2.  Low B12 levels: - B12 level was 171 with normal methylmalonic acid.  Copper and folic acid were normal. - She will be given B12 injection today.  We will start her on B12 1 mg tablet daily.  We will check levels at next visit.  Orders placed this encounter:  No orders of the defined types were placed in this encounter.    Derek Jack,  MD [place] [phone]   I, Thana Ates, am acting as a scribe for Dr. Derek Jack.  I, Derek Jack MD, have reviewed the above documentation for accuracy and completeness, and I agree with the above.

## 2021-10-29 ENCOUNTER — Other Ambulatory Visit: Payer: Self-pay

## 2021-10-29 ENCOUNTER — Inpatient Hospital Stay (HOSPITAL_COMMUNITY): Payer: Medicare Other

## 2021-10-29 ENCOUNTER — Inpatient Hospital Stay (HOSPITAL_COMMUNITY): Payer: Medicare Other | Attending: Hematology | Admitting: Hematology

## 2021-10-29 VITALS — BP 129/79 | HR 75 | Temp 98.3°F | Resp 19 | Wt 239.6 lb

## 2021-10-29 DIAGNOSIS — D509 Iron deficiency anemia, unspecified: Secondary | ICD-10-CM

## 2021-10-29 DIAGNOSIS — D649 Anemia, unspecified: Secondary | ICD-10-CM

## 2021-10-29 DIAGNOSIS — E538 Deficiency of other specified B group vitamins: Secondary | ICD-10-CM | POA: Diagnosis present

## 2021-10-29 MED ORDER — CYANOCOBALAMIN 1000 MCG/ML IJ SOLN
1000.0000 ug | Freq: Once | INTRAMUSCULAR | Status: AC
  Administered 2021-10-29: 1000 ug via INTRAMUSCULAR
  Filled 2021-10-29: qty 1

## 2021-10-29 NOTE — Progress Notes (Signed)
Patient tolerated injection with no complaints voiced.  Site clean and dry with no bruising or swelling noted.  No complaints of pain.  Discharged with vital signs stable and no signs or symptoms of distress noted.

## 2021-10-29 NOTE — Patient Instructions (Signed)
[place] at [facility] Discharge Instructions  You were seen and examined today by Dr. Delton Coombes. He reviewed your labs and recommends that you start taking over the counter Iron 325 mg once daily and Vitamin B-12 1 mg daily. You can also start taking a stool softener like colace to help curve constipation that may be caused by the iron. Call us sooner if you have any issues. Please keep your follow up as scheduled.   Thank you for choosing [place] at [facility] to provide your oncology and hematology care.  To afford each patient quality time with our provider, please arrive at least 15 minutes before your scheduled appointment time.   If you have a lab appointment with the [facility] please come in thru the Main Entrance and check in at the main information desk.  You need to re-schedule your appointment should you arrive 10 or more minutes late.  We strive to give you quality time with our providers, and arriving late affects you and other patients whose appointments are after yours.  Also, if you no show three or more times for appointments you may be dismissed from the clinic at the providers discretion.     Again, thank you for choosing [facility].  Our hope is that these requests will decrease the amount of time that you wait before being seen by our physicians.       _____________________________________________________________  Should you have questions after your visit to [facility], please contact our office at [phone] and follow the prompts.  Our office hours are 8:00 a.m. and 4:30 p.m. Monday - Friday.  Please note that voicemails left after 4:00 p.m. may not be returned until the following business day.  We are closed weekends and major holidays.  You do have access to a nurse 24-7, just call the main number to the clinic [phone] and do not press any options, hold on the line and a nurse will  answer the phone.    For prescription refill requests, have your pharmacy contact our office and allow 72 hours.    Due to Covid, you will need to wear a mask upon entering the hospital. If you do not have a mask, a mask will be given to you at the Main Entrance upon arrival. For doctor visits, patients may have 1 support person age 55 or older with them. For treatment visits, patients can not have anyone with them due to social distancing guidelines and our immunocompromised population.

## 2021-10-29 NOTE — Progress Notes (Signed)
Patient has been assessed, vital signs and labs have been reviewed by Dr. Delton Coombes. ANC, Creatinine, LFTs, and Platelets are within treatment parameters per Dr. Delton Coombes. The patient is good to proceed with Vitamin B12 injection at this time. Order placed in injection encounter per Dr. Delton Coombes. Primary RN and pharmacy aware.

## 2021-11-26 ENCOUNTER — Encounter: Payer: Self-pay | Admitting: Gastroenterology

## 2021-11-26 ENCOUNTER — Ambulatory Visit (INDEPENDENT_AMBULATORY_CARE_PROVIDER_SITE_OTHER): Payer: Medicare Other | Admitting: Gastroenterology

## 2021-11-26 VITALS — BP 122/80 | HR 70 | Ht 69.0 in | Wt 228.4 lb

## 2021-11-26 DIAGNOSIS — K257 Chronic gastric ulcer without hemorrhage or perforation: Secondary | ICD-10-CM | POA: Diagnosis not present

## 2021-11-26 DIAGNOSIS — K219 Gastro-esophageal reflux disease without esophagitis: Secondary | ICD-10-CM | POA: Diagnosis not present

## 2021-11-26 DIAGNOSIS — D500 Iron deficiency anemia secondary to blood loss (chronic): Secondary | ICD-10-CM

## 2021-11-26 NOTE — Patient Instructions (Signed)
Follow up in one year.   The [facility] providers would like to encourage you to use [facility] to communicate with providers for non-urgent requests or questions.  Due to long hold times on the telephone, sending your provider a message by [facility] may be a faster and more efficient way to get a response.  Please allow 48 business hours for a response.  Please remember that this is for non-urgent requests.   Thank you for choosing me and [facility].  Pricilla Riffle. Dagoberto Ligas., MD., [facility]

## 2021-11-26 NOTE — Progress Notes (Signed)
    History of Present Illness: This is a 70 year old female returning for follow-up of GERD and iron deficiency anemia.  She is accompanied by her husband.  EGD in October showed a medium-sized hiatal hernia with Lysbeth Galas erosions which were felt to be the source of chronic blood loss leading to iron deficiency anemia.  Her reflux symptoms are under very good control. She also has B12 deficiency.  She is followed by her PCP,  Gar Ponto MD, and her hematologist, Derek Jack, MD.   Current Medications, Allergies, Past Medical History, Past Surgical History, Family History and Social History were reviewed in [employer] record.   Physical Exam: General: Well developed, well nourished, no acute distress Head: Normocephalic and atraumatic Eyes: Sclerae anicteric, EOMI Ears: Normal auditory acuity Mouth: Not examined, mask on during Covid-19 pandemic Lungs: Clear throughout to auscultation Heart: Regular rate and rhythm; no murmurs, rubs or bruits Abdomen: Soft, non tender and non distended. No masses, hepatosplenomegaly or hernias noted. Normal Bowel sounds Rectal: Not done Musculoskeletal: Symmetrical with no gross deformities  Pulses:  Normal pulses noted Extremities: No clubbing, cyanosis, edema or deformities noted Neurological: Alert oriented x 4, grossly nonfocal Psychological:  Alert and cooperative. Normal mood and affect   Assessment and Recommendations:  GERD with a medium sized hiatal hernia and Cameron erosions felt to be the source of iron deficiency anemia.  Continue follow-up with your PCP and your hematologist for mgmt of IDA and B12 deficiency.  Closely follow antireflux measures long-term.  Continue pantoprazole 40 mg p.o. twice daily. REV in 1 year. Personal history of adenomatous colon polyps.  Surveillance colonoscopy is recommended in March 2029.

## 2021-12-04 ENCOUNTER — Telehealth: Payer: Self-pay

## 2021-12-04 NOTE — Telephone Encounter (Signed)
Letter has been sent to patient instructing them to call us if they are still interested in completing their sleep study. If we have not received a response from the patient within 30 days of this notice, the order will be cancelled and they will need to discuss the need for a sleep study at their next office visit.  ° °

## 2022-01-08 ENCOUNTER — Ambulatory Visit: Payer: Medicare Other | Admitting: Dermatology

## 2022-01-20 ENCOUNTER — Other Ambulatory Visit (HOSPITAL_COMMUNITY): Payer: Medicare Other

## 2022-01-20 ENCOUNTER — Other Ambulatory Visit: Payer: Self-pay

## 2022-01-20 ENCOUNTER — Inpatient Hospital Stay (HOSPITAL_COMMUNITY): Payer: Medicare Other | Attending: Hematology

## 2022-01-20 DIAGNOSIS — D509 Iron deficiency anemia, unspecified: Secondary | ICD-10-CM | POA: Diagnosis present

## 2022-01-20 DIAGNOSIS — E538 Deficiency of other specified B group vitamins: Secondary | ICD-10-CM | POA: Insufficient documentation

## 2022-01-20 DIAGNOSIS — K5900 Constipation, unspecified: Secondary | ICD-10-CM | POA: Diagnosis not present

## 2022-01-20 DIAGNOSIS — D649 Anemia, unspecified: Secondary | ICD-10-CM

## 2022-01-20 DIAGNOSIS — Z79899 Other long term (current) drug therapy: Secondary | ICD-10-CM | POA: Diagnosis not present

## 2022-01-20 LAB — CBC WITH DIFFERENTIAL/PLATELET
Abs Immature Granulocytes: 0.02 10*3/uL (ref 0.00–0.07)
Basophils Absolute: 0 10*3/uL (ref 0.0–0.1)
Basophils Relative: 0 %
Eosinophils Absolute: 0 10*3/uL (ref 0.0–0.5)
Eosinophils Relative: 0 %
HCT: 42.6 % (ref 36.0–46.0)
Hemoglobin: 13.8 g/dL (ref 12.0–15.0)
Immature Granulocytes: 0 %
Lymphocytes Relative: 18 %
Lymphs Abs: 1.2 10*3/uL (ref 0.7–4.0)
MCH: 30.1 pg (ref 26.0–34.0)
MCHC: 32.4 g/dL (ref 30.0–36.0)
MCV: 93 fL (ref 80.0–100.0)
Monocytes Absolute: 0.5 10*3/uL (ref 0.1–1.0)
Monocytes Relative: 8 %
Neutro Abs: 4.6 10*3/uL (ref 1.7–7.7)
Neutrophils Relative %: 74 %
Platelets: 165 10*3/uL (ref 150–400)
RBC: 4.58 MIL/uL (ref 3.87–5.11)
RDW: 14.7 % (ref 11.5–15.5)
WBC: 6.3 10*3/uL (ref 4.0–10.5)
nRBC: 0 % (ref 0.0–0.2)

## 2022-01-20 LAB — IRON AND TIBC
Iron: 56 ug/dL (ref 28–170)
Saturation Ratios: 17 % (ref 10.4–31.8)
TIBC: 336 ug/dL (ref 250–450)
UIBC: 280 ug/dL

## 2022-01-20 LAB — RETICULOCYTES
Immature Retic Fract: 12.7 % (ref 2.3–15.9)
RBC.: 4.66 MIL/uL (ref 3.87–5.11)
Retic Count, Absolute: 78.8 10*3/uL (ref 19.0–186.0)
Retic Ct Pct: 1.7 % (ref 0.4–3.1)

## 2022-01-20 LAB — FERRITIN: Ferritin: 46 ng/mL (ref 11–307)

## 2022-01-20 LAB — VITAMIN B12: Vitamin B-12: 548 pg/mL (ref 180–914)

## 2022-01-26 NOTE — Progress Notes (Signed)
Shannon Figueroa, [place] [address]   CLINIC:  Medical Oncology/Hematology  PCP:  Caryl Bis, MD [place] [address] [phone]   REASON FOR VISIT:  Follow-up for iron deficiency anemia  PRIOR THERAPY: IV iron as needed, PRBC transfusion  CURRENT THERAPY: Oral iron tablet  INTERVAL HISTORY:  Shannon Figueroa 71 y.o. female returns for routine follow-up of her iron deficiency anemia.  She was last seen by Dr. Delton Coombes on 10/29/2021.  At today's visit, she reports feeling well.  No recent hospitalizations, surgeries, or changes in baseline health status.  She has never noticed any obvious bleeding such as hematochezia, melena, or hematemesis.  She does report that her stool has been dark ever since she was started on iron pill.  She reports that she is feeling "much better" and denies any fatigue, pica, headaches, lightheadedness, or syncope.  She does have some restless leg symptoms that have been ongoing for some time.  She denies any chest pain or dyspnea on exertion.  She continues to take iron tablet daily with some mild constipation.  She is taking B12 tablet daily.  She has 100% energy and 100% appetite. She endorses that she is maintaining a stable weight.   REVIEW OF SYSTEMS:  Review of Systems  Constitutional:  Negative for appetite change, chills, diaphoresis, fatigue, fever and unexpected weight change.  HENT:   Negative for lump/mass and nosebleeds.   Eyes:  Negative for eye problems.  Respiratory:  Negative for cough, hemoptysis and shortness of breath.   Cardiovascular:  Negative for chest pain, leg swelling and palpitations.  Gastrointestinal:  Positive for constipation. Negative for abdominal pain, blood in stool, diarrhea, nausea and vomiting.  Genitourinary:  Negative for hematuria.   Skin: Negative.   Neurological:  Negative for dizziness, headaches and light-headedness.  Hematological:  Does not bruise/bleed easily.   Psychiatric/Behavioral:  Positive for sleep disturbance.      PAST MEDICAL/SURGICAL HISTORY:  Past Medical History:  Diagnosis Date   Allergy    Anemia    GERD (gastroesophageal reflux disease)    Graves disease    Hiatal hernia    History of colon polyps    IDA (iron deficiency anemia)    Thyroid disease    Past Surgical History:  Procedure Laterality Date   ABDOMINAL HYSTERECTOMY     BACK SURGERY     BREAST EXCISIONAL BIOPSY Right    benign   COLONOSCOPY  2016   ovaries removed  2010   POLYPECTOMY     THYROIDECTOMY       SOCIAL HISTORY:  Social History   Socioeconomic History   Marital status: Married    Spouse name: Not on file   Number of children: Not on file   Years of education: Not on file   Highest education level: Not on file  Occupational History   Not on file  Tobacco Use   Smoking status: Never   Smokeless tobacco: Never  Vaping Use   Vaping Use: Never used  Substance and Sexual Activity   Alcohol use: No    Alcohol/week: 0.0 standard drinks   Drug use: No   Sexual activity: Not on file  Other Topics Concern   Not on file  Social History Narrative   Not on file   Social Determinants of Health   Financial Resource Strain: Not on file  Food Insecurity: Not on file  Transportation Needs: Not on file  Physical Activity:  Not on file  Stress: Not on file  Social Connections: Not on file  Intimate Partner Violence: Not on file    FAMILY HISTORY:  Family History  Problem Relation Age of Onset   Cancer Brother 73       anal cancer-   Rectal cancer Brother 8       started as anal cancer   Colon cancer Maternal Grandmother        unsure if styarted in colon- had metastasized all over her    Esophageal cancer Neg Hx    Colon polyps Neg Hx     CURRENT MEDICATIONS:  Outpatient Encounter Medications as of 01/27/2022  Medication Sig   Ferrous Sulfate (IRON) 325 (65 Fe) MG TABS Take by mouth.   fluticasone (FLONASE) 50 MCG/ACT nasal  spray Place 1 spray into both nostrils daily.   gabapentin (NEURONTIN) 300 MG capsule Take 300 mg by mouth at bedtime.   levothyroxine (SYNTHROID) 88 MCG tablet Take 88 mcg by mouth in the morning.   Multiple Vitamins-Minerals (WOMENS MULTI VITAMIN & MINERAL PO) Take by mouth.   pantoprazole (PROTONIX) 40 MG tablet Take 40 mg by mouth 2 (two) times daily.   timolol (TIMOPTIC) 0.5 % ophthalmic solution SMARTSIG:In Eye(s)   vitamin B-12 (CYANOCOBALAMIN) 1000 MCG tablet Take 1,000 mcg by mouth daily.   zolpidem (AMBIEN) 10 MG tablet Take 10 mg by mouth at bedtime. Patient takes as needed.   No facility-administered encounter medications on file as of 01/27/2022.    ALLERGIES:  Allergies  Allergen Reactions   Clindamycin/Lincomycin Other (See Comments)   Codeine      GI Upset   Dexlansoprazole Diarrhea   Esomeprazole Magnesium Diarrhea    nexium    Levofloxacin Other (See Comments)   Rabeprazole Sodium     REACTION: Headaches   Valium [Diazepam] Other (See Comments)   Penicillins Rash     PHYSICAL EXAM:  ECOG PERFORMANCE STATUS: 0 - Asymptomatic  There were no vitals filed for this visit. There were no vitals filed for this visit. Physical Exam Constitutional:      Appearance: Normal appearance. She is obese.  HENT:     Head: Normocephalic and atraumatic.     Mouth/Throat:     Mouth: Mucous membranes are moist.  Eyes:     Extraocular Movements: Extraocular movements intact.     Pupils: Pupils are equal, round, and reactive to light.  Cardiovascular:     Rate and Rhythm: Normal rate and regular rhythm.     Pulses: Normal pulses.     Heart sounds: Normal heart sounds.  Pulmonary:     Effort: Pulmonary effort is normal.     Breath sounds: Normal breath sounds.  Abdominal:     General: Bowel sounds are normal.     Palpations: Abdomen is soft.     Tenderness: There is no abdominal tenderness.  Musculoskeletal:        General: No swelling.     Right lower leg: No edema.      Left lower leg: No edema.  Lymphadenopathy:     Cervical: No cervical adenopathy.  Skin:    General: Skin is warm and dry.  Neurological:     General: No focal deficit present.     Mental Status: She is alert and oriented to person, place, and time.  Psychiatric:        Mood and Affect: Mood normal.        Behavior: Behavior normal.  LABORATORY DATA:  I have reviewed the labs as listed.  CBC    Component Value Date/Time   WBC 6.3 01/20/2022 1114   RBC 4.66 01/20/2022 1115   RBC 4.58 01/20/2022 1114   HGB 13.8 01/20/2022 1114   HCT 42.6 01/20/2022 1114   PLT 165 01/20/2022 1114   MCV 93.0 01/20/2022 1114   MCH 30.1 01/20/2022 1114   MCHC 32.4 01/20/2022 1114   RDW 14.7 01/20/2022 1114   LYMPHSABS 1.2 01/20/2022 1114   MONOABS 0.5 01/20/2022 1114   EOSABS 0.0 01/20/2022 1114   BASOSABS 0.0 01/20/2022 1114   CMP Latest Ref Rng & Units 02/05/2017  Glucose 70 - 99 mg/dL 137(H)  BUN 6 - 23 mg/dL 11  Creatinine 0.40 - 1.20 mg/dL 1.02  Sodium 135 - 145 mEq/L 142  Potassium 3.5 - 5.1 mEq/L 3.9  Chloride 96 - 112 mEq/L 106  CO2 19 - 32 mEq/L 29  Calcium 8.4 - 10.5 mg/dL 9.3  Total Protein 6.0 - 8.3 g/dL 6.8  Total Bilirubin 0.2 - 1.2 mg/dL 0.7  Alkaline Phos 39 - 117 U/L 79  AST 0 - 37 U/L 14  ALT 0 - 35 U/L 12    DIAGNOSTIC IMAGING:  I have independently reviewed the relevant imaging and discussed with the patient.  ASSESSMENT & PLAN: 1.  Severe iron deficiency anemia: - Reported fatigue, shortness of breath and dizziness for several months, found to have Hgb 5.5, ferritin 3, MCV 65 (08/26/2021). - Received 1 unit PRBC and 2 infusions of IV Feraheme in September 2022 - Colonoscopy (02/26/2021): Diverticulosis and polyps - EGD (10/09/2021): Gastric erosions and hiatal hernia, without active bleeding.  Per GI note, these Lysbeth Galas lesions were felt to be the source of chronic blood loss leading to iron deficiency anemia. - SPEP negative - She reports to be on  antacids for the past several years for hiatal hernia and acid reflux. - She follows [facility] (Dr. Fuller Plan) - She was started on an iron tablet once daily at her visit in November 2022. - She has not noticed any bright red blood per rectum or melena - Symptoms of pica, fatigue, DOE, chest pain, and lightheadedness have resolved - Most recent labs (01/20/2022): Hgb 13.8, ferritin 46 (previously 19), iron saturation 17% - PLAN: No indication for IV iron at this time. - Continue taking oral iron tablet once daily. - Repeat labs and RTC with phone visit in 4 months.  2.  Vitamin B12 deficiency: - B12 level (10/04/2021) was low at 171 with normal methylmalonic acid.  Copper and folic acid were normal. - She received B12 injection in November 2022. - She was started on B12 cyanocobalamin 1 mg tablet daily in November 2022. - Most recent labs (01/20/2022) show improved B12 at 548 - PLAN: Continue vitamin B12 supplement daily.  3.  Family/social history: - Lives at home with her husband.  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  Non-smoker.  No family history of anemia.  Maternal grandmother died of metastatic cancer.  Brother died of anal cancer.   PLAN SUMMARY & DISPOSITION: Labs in 4 months Phone visit after labs  All questions were answered. The patient knows to call the clinic with any problems, questions or concerns.  Medical decision making: Low  Time spent on visit: I spent 15 minutes counseling the patient face to face. The total time spent in the appointment was 20 minutes and more than 50% was on counseling.   Harriett Rush,  PA-C  01/27/2022 12:46 PM

## 2022-01-27 ENCOUNTER — Ambulatory Visit (HOSPITAL_COMMUNITY): Payer: Medicare Other | Admitting: Hematology

## 2022-01-27 ENCOUNTER — Inpatient Hospital Stay (HOSPITAL_BASED_OUTPATIENT_CLINIC_OR_DEPARTMENT_OTHER): Payer: Medicare Other | Admitting: Physician Assistant

## 2022-01-27 ENCOUNTER — Other Ambulatory Visit: Payer: Self-pay

## 2022-01-27 VITALS — Ht 69.0 in | Wt 227.7 lb

## 2022-01-27 DIAGNOSIS — E538 Deficiency of other specified B group vitamins: Secondary | ICD-10-CM

## 2022-01-27 DIAGNOSIS — D509 Iron deficiency anemia, unspecified: Secondary | ICD-10-CM

## 2022-01-27 NOTE — Patient Instructions (Signed)
[place] at [facility] Discharge Instructions  You were seen today by Tarri Abernethy PA-C for your iron deficiency anemia.  Your blood and iron levels looked great!    LABS: Return in 4 months for repeat labs   OTHER TESTS: None  MEDICATIONS: Continue daily iron and B12 supplements  FOLLOW-UP APPOINTMENT: Phone visit in 4 months, after labs   Thank you for choosing [place] at [facility] to provide your oncology and hematology care.  To afford each patient quality time with our provider, please arrive at least 15 minutes before your scheduled appointment time.   If you have a lab appointment with the [facility] please come in thru the Main Entrance and check in at the main information desk.  You need to re-schedule your appointment should you arrive 10 or more minutes late.  We strive to give you quality time with our providers, and arriving late affects you and other patients whose appointments are after yours.  Also, if you no show three or more times for appointments you may be dismissed from the clinic at the providers discretion.     Again, thank you for choosing [facility].  Our hope is that these requests will decrease the amount of time that you wait before being seen by our physicians.       _____________________________________________________________  Should you have questions after your visit to [facility], please contact our office at [phone] and follow the prompts.  Our office hours are 8:00 a.m. and 4:30 p.m. Monday - Friday.  Please note that voicemails left after 4:00 p.m. may not be returned until the following business day.  We are closed weekends and major holidays.  You do have access to a nurse 24-7, just call the main number to the clinic [phone] and do not press any options, hold on the line and a nurse will answer the phone.    For prescription refill requests, have your  pharmacy contact our office and allow 72 hours.    Due to Covid, you will need to wear a mask upon entering the hospital. If you do not have a mask, a mask will be given to you at the Main Entrance upon arrival. For doctor visits, patients may have 1 support person age 52 or older with them. For treatment visits, patients can not have anyone with them due to social distancing guidelines and our immunocompromised population.

## 2022-01-29 ENCOUNTER — Other Ambulatory Visit: Payer: Self-pay | Admitting: Physician Assistant

## 2022-01-29 ENCOUNTER — Other Ambulatory Visit: Payer: Self-pay | Admitting: Family Medicine

## 2022-01-29 DIAGNOSIS — Z1231 Encounter for screening mammogram for malignant neoplasm of breast: Secondary | ICD-10-CM

## 2022-02-04 ENCOUNTER — Ambulatory Visit: Payer: Medicare Other | Admitting: Cardiology

## 2022-02-19 ENCOUNTER — Ambulatory Visit
Admission: RE | Admit: 2022-02-19 | Discharge: 2022-02-19 | Disposition: A | Discharge status: Home / Self Care | Payer: Medicare Other | Source: Ambulatory Visit | Attending: Physician Assistant | Admitting: Physician Assistant

## 2022-02-19 DIAGNOSIS — Z1231 Encounter for screening mammogram for malignant neoplasm of breast: Secondary | ICD-10-CM

## 2022-05-30 ENCOUNTER — Inpatient Hospital Stay (HOSPITAL_COMMUNITY): Payer: Medicare Other | Attending: Hematology

## 2022-05-30 DIAGNOSIS — D509 Iron deficiency anemia, unspecified: Secondary | ICD-10-CM | POA: Insufficient documentation

## 2022-05-30 DIAGNOSIS — E538 Deficiency of other specified B group vitamins: Secondary | ICD-10-CM

## 2022-05-30 LAB — IRON AND TIBC
Iron: 77 ug/dL (ref 28–170)
Saturation Ratios: 23 % (ref 10.4–31.8)
TIBC: 332 ug/dL (ref 250–450)
UIBC: 255 ug/dL

## 2022-05-30 LAB — CBC WITH DIFFERENTIAL/PLATELET
Abs Immature Granulocytes: 0.01 10*3/uL (ref 0.00–0.07)
Basophils Absolute: 0 10*3/uL (ref 0.0–0.1)
Basophils Relative: 0 %
Eosinophils Absolute: 0 10*3/uL (ref 0.0–0.5)
Eosinophils Relative: 0 %
HCT: 44.6 % (ref 36.0–46.0)
Hemoglobin: 14.3 g/dL (ref 12.0–15.0)
Immature Granulocytes: 0 %
Lymphocytes Relative: 22 %
Lymphs Abs: 1.2 10*3/uL (ref 0.7–4.0)
MCH: 29.7 pg (ref 26.0–34.0)
MCHC: 32.1 g/dL (ref 30.0–36.0)
MCV: 92.5 fL (ref 80.0–100.0)
Monocytes Absolute: 0.4 10*3/uL (ref 0.1–1.0)
Monocytes Relative: 7 %
Neutro Abs: 3.9 10*3/uL (ref 1.7–7.7)
Neutrophils Relative %: 71 %
Platelets: 148 10*3/uL — ABNORMAL LOW (ref 150–400)
RBC: 4.82 MIL/uL (ref 3.87–5.11)
RDW: 14.2 % (ref 11.5–15.5)
WBC: 5.5 10*3/uL (ref 4.0–10.5)
nRBC: 0 % (ref 0.0–0.2)

## 2022-05-30 LAB — VITAMIN B12: Vitamin B-12: 485 pg/mL (ref 180–914)

## 2022-05-30 LAB — FERRITIN: Ferritin: 64 ng/mL (ref 11–307)

## 2022-06-02 LAB — METHYLMALONIC ACID, SERUM: Methylmalonic Acid, Quantitative: 92 nmol/L (ref 0–378)

## 2022-06-05 NOTE — Progress Notes (Signed)
Virtual Visit via Telephone Note [facility]  I connected with Shannon Figueroa  on 06/06/22 at 2:02 PM by telephone and verified that I am speaking with the correct person using two identifiers.  Location: Patient: Home Provider: [facility]   I discussed the limitations, risks, security and privacy concerns of performing an evaluation and management service by telephone and the availability of in person appointments. I also discussed with the patient that there may be a patient responsible charge related to this service. The patient expressed understanding and agreed to proceed.  REASON FOR VISIT:  Follow-up for iron deficiency anemia   PRIOR THERAPY: IV iron as needed (last given September 2022), PRBC transfusion (September 2022)   CURRENT THERAPY: Oral iron tablet  INTERVAL HISTORY: Shannon Figueroa is contacted today for follow-up of her iron deficiency anemia.  She was last seen by Tarri Abernethy PA-C on 01/27/2022. At today's visit, she reports feeling well.  No recent hospitalizations, surgeries, or changes in baseline health status.  She has 100% energy and 100% appetite. She endorses that she is maintaining a stable weight.. She has never noticed any obvious bleeding such as hematochezia, melena, or hematemesis.  She does report that her stool has been dark ever since she was started on iron pill.  She reports that she is feeling "much better" and denies any fatigue, pica, headaches, lightheadedness, or syncope.  She does have some restless leg symptoms that have been ongoing for some time.  She denies any chest pain or dyspnea on exertion.  She continues to take iron tablet daily with some mild constipation.  She is taking B12 tablet daily.     OBSERVATIONS/OBJECTIVE: Review of Systems  Constitutional:  Negative for chills, diaphoresis, fever, malaise/fatigue and weight loss.  Respiratory:  Negative for cough and shortness of breath.    Cardiovascular:  Negative for chest pain and palpitations.  Gastrointestinal:  Negative for abdominal pain, blood in stool, melena, nausea and vomiting.  Neurological:  Negative for dizziness and headaches.  Psychiatric/Behavioral:  The patient has insomnia.      PHYSICAL EXAM (per limitations of virtual telephone visit): The patient is alert and oriented x 3, exhibiting adequate mentation, good mood, and ability to speak in full sentences and execute sound judgement.   ASSESSMENT & PLAN: 1.  Severe iron deficiency anemia: - Reported fatigue, shortness of breath and dizziness for several months, found to have Hgb 5.5, ferritin 3, MCV 65 (08/26/2021). - Received 1 unit PRBC and 2 infusions of IV Feraheme in September 2022 - Colonoscopy (02/26/2021): Diverticulosis and polyps - EGD (10/09/2021): Gastric erosions and hiatal hernia, without active bleeding.  Per GI note, these Lysbeth Galas lesions were felt to be the source of chronic blood loss leading to iron deficiency anemia. - SPEP negative - She reports to be on antacids for the past several years for hiatal hernia and acid reflux. - She follows with [facility] (Dr. Fuller Plan) - She was started on an iron tablet once daily at her visit in November 2022. - She has not noticed any bright red blood per rectum or melena  - Symptoms of pica, fatigue, DOE, chest pain, and lightheadedness have resolved  - Most recent labs (05/30/2022): Hgb 14.3/MCV 92.5, platelets 148.  Ferritin 64, iron saturation 23%. - PLAN: No indication for IV iron at this time.  Iron levels continue to improve on oral iron supplementation. - Continue taking oral iron tablet once daily. - Repeat  labs and RTC with phone visit in 6 months.   2.  Vitamin B12 deficiency: - B12 level (10/04/2021) was low at 171 with normal methylmalonic acid.  Copper and folic acid were normal. - She received B12 injection in November 2022. - She was started on B12 cyanocobalamin 1 mg  tablet daily in November 2022. - Most recent labs (05/30/2022) showed normal vitamin B12 485 with normal methylmalonic acid. - PLAN: Continue vitamin B12 supplement daily.   3.  Family/social history: - Lives at home with her husband.  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  Non-smoker.  No family history of anemia.  Maternal grandmother died of metastatic cancer.  Brother died of anal cancer.   FOLLOW UP INSTRUCTIONS: Labs in 6 months PHONE visit after labs    I discussed the assessment and treatment plan with the patient. The patient was provided an opportunity to ask questions and all were answered. The patient agreed with the plan and demonstrated an understanding of the instructions.   The patient was advised to call back or seek an in-person evaluation if the symptoms worsen or if the condition fails to improve as anticipated.  I provided 13 minutes of non-face-to-face time during this encounter.   Harriett Rush, PA-C 06/06/22 2:15 PM

## 2022-06-06 ENCOUNTER — Inpatient Hospital Stay (HOSPITAL_BASED_OUTPATIENT_CLINIC_OR_DEPARTMENT_OTHER): Payer: Medicare Other | Admitting: Physician Assistant

## 2022-06-06 DIAGNOSIS — D509 Iron deficiency anemia, unspecified: Secondary | ICD-10-CM

## 2022-06-06 DIAGNOSIS — E538 Deficiency of other specified B group vitamins: Secondary | ICD-10-CM

## 2022-12-05 ENCOUNTER — Inpatient Hospital Stay: Payer: Medicare Other | Attending: Physician Assistant

## 2022-12-05 DIAGNOSIS — D509 Iron deficiency anemia, unspecified: Secondary | ICD-10-CM | POA: Diagnosis present

## 2022-12-05 DIAGNOSIS — E538 Deficiency of other specified B group vitamins: Secondary | ICD-10-CM

## 2022-12-05 LAB — CBC WITH DIFFERENTIAL/PLATELET
Abs Immature Granulocytes: 0.02 10*3/uL (ref 0.00–0.07)
Basophils Absolute: 0 10*3/uL (ref 0.0–0.1)
Basophils Relative: 1 %
Eosinophils Absolute: 0 10*3/uL (ref 0.0–0.5)
Eosinophils Relative: 0 %
HCT: 42.3 % (ref 36.0–46.0)
Hemoglobin: 14 g/dL (ref 12.0–15.0)
Immature Granulocytes: 0 %
Lymphocytes Relative: 20 %
Lymphs Abs: 1.3 10*3/uL (ref 0.7–4.0)
MCH: 29.2 pg (ref 26.0–34.0)
MCHC: 33.1 g/dL (ref 30.0–36.0)
MCV: 88.3 fL (ref 80.0–100.0)
Monocytes Absolute: 0.5 10*3/uL (ref 0.1–1.0)
Monocytes Relative: 7 %
Neutro Abs: 4.7 10*3/uL (ref 1.7–7.7)
Neutrophils Relative %: 72 %
Platelets: 165 10*3/uL (ref 150–400)
RBC: 4.79 MIL/uL (ref 3.87–5.11)
RDW: 14 % (ref 11.5–15.5)
WBC: 6.5 10*3/uL (ref 4.0–10.5)
nRBC: 0 % (ref 0.0–0.2)

## 2022-12-05 LAB — VITAMIN B12: Vitamin B-12: 876 pg/mL (ref 180–914)

## 2022-12-05 LAB — IRON AND TIBC
Iron: 53 ug/dL (ref 28–170)
Saturation Ratios: 16 % (ref 10.4–31.8)
TIBC: 337 ug/dL (ref 250–450)
UIBC: 284 ug/dL

## 2022-12-05 LAB — FERRITIN: Ferritin: 73 ng/mL (ref 11–307)

## 2022-12-05 LAB — FOLATE: Folate: 18.2 ng/mL (ref >5.9)

## 2022-12-08 LAB — HOMOCYSTEINE: Homocysteine: 9.5 umol/L (ref 0.0–17.2)

## 2022-12-10 LAB — METHYLMALONIC ACID, SERUM: Methylmalonic Acid, Quantitative: 109 nmol/L (ref 0–378)

## 2022-12-12 ENCOUNTER — Inpatient Hospital Stay (HOSPITAL_BASED_OUTPATIENT_CLINIC_OR_DEPARTMENT_OTHER): Payer: Medicare Other | Admitting: Physician Assistant

## 2022-12-12 ENCOUNTER — Other Ambulatory Visit: Payer: Self-pay

## 2022-12-12 DIAGNOSIS — D509 Iron deficiency anemia, unspecified: Secondary | ICD-10-CM

## 2022-12-12 DIAGNOSIS — E538 Deficiency of other specified B group vitamins: Secondary | ICD-10-CM

## 2022-12-12 DIAGNOSIS — D649 Anemia, unspecified: Secondary | ICD-10-CM

## 2022-12-12 NOTE — Progress Notes (Signed)
Virtual Visit via Telephone Note [facility]  I connected with Shannon Figueroa  on 12/12/22 at 12:54 PM via telephone and verified that I am speaking with the correct person using two identifiers.  Location: Patient: Home Provider: [facility]   I discussed the limitations, risks, security and privacy concerns of performing an evaluation and management service by telephone and the availability of in person appointments. I also discussed with the patient that there may be a patient responsible charge related to this service. The patient expressed understanding and agreed to proceed.  REASON FOR VISIT:  Follow-up for iron deficiency anemia   PRIOR THERAPY: IV iron as needed (last given September 2022), PRBC transfusion (September 2022)   CURRENT THERAPY: Oral iron tablet  INTERVAL HISTORY: Ms. MALEE Figueroa (71 year old female) is contacted today for follow-up of her iron deficiency anemia.  She was last via telemedicine visit by Tarri Abernethy PA-C on 06/06/2022.  At today's visit, she reports feeling well.  No recent hospitalizations, surgeries, or changes in baseline health status.  She has 75% energy and 100% appetite. She endorses that she is maintaining a stable weight.  She never noticed any obvious bleeding such as hematochezia, melena, or hematemesis.  She does report that her stool has been dark ever since she was started on iron pill.  She reports that she is feeling "much better" and denies any fatigue, pica, headaches, lightheadedness, or syncope. She does have some restless leg symptoms that have been ongoing for some time.  She denies any chest pain or dyspnea on exertion.  She continues to take iron tablet daily with some mild constipation.  She is taking B12 tablet daily.     OBSERVATIONS/OBJECTIVE: Review of Systems  Constitutional:  Negative for chills, diaphoresis, fever, malaise/fatigue and weight loss.  Respiratory:  Negative for cough and  shortness of breath.   Cardiovascular:  Negative for chest pain and palpitations.  Gastrointestinal:  Negative for abdominal pain, blood in stool, melena, nausea and vomiting.  Neurological:  Negative for dizziness and headaches.  Psychiatric/Behavioral:  The patient has insomnia.      PHYSICAL EXAM (per limitations of virtual telephone visit): The patient is alert and oriented x 3, exhibiting adequate mentation, good mood, and ability to speak in full sentences and execute sound judgement.   ASSESSMENT & PLAN: 1.  Severe iron deficiency anemia: - Reported fatigue, shortness of breath and dizziness for several months, found to have Hgb 5.5, ferritin 3, MCV 65 (08/26/2021). - Received 1 unit PRBC and 2 infusions of IV Feraheme in September 2022 - Colonoscopy (02/26/2021): Diverticulosis and polyps - EGD (10/09/2021): Gastric erosions and hiatal hernia, without active bleeding.  Per GI note, these Lysbeth Galas lesions were felt to be the source of chronic blood loss leading to iron deficiency anemia. - SPEP negative - She reports to be on antacids for the past several years for hiatal hernia and acid reflux. - She follows with [facility] (Dr. Fuller Plan) - She was started on an iron tablet once daily at her visit in November 2022. - She has not noticed any bright red blood per rectum or melena - Symptoms of pica, fatigue, DOE, chest pain, and lightheadedness have resolved - Most recent labs (12/05/2022): Hgb 14.0/MCV 88.3, platelets 165.  Ferritin 73, iron saturation 16 %. - PLAN: No indication for IV iron at this time.  Iron levels continue to improve on oral iron supplementation. - Continue taking oral iron tablet once  daily. - Repeat labs and RTC with PHONE visit in 6 months.  (For the next follow-up visit, we will plan on office visit)   2.  Vitamin B12 deficiency: - B12 level (10/04/2021) was low at 171 with normal methylmalonic acid.  Copper and folic acid were normal. - She received  B12 injection in November 2022. - She was started on B12 cyanocobalamin 1 mg tablet daily in November 2022. - Most recent labs (12/05/2022): Normal vitamin B12 876, normal MMA - Additional labs from 12/05/2022 show normal homocystine and folate.  Copper is PENDING. - PLAN: Continue vitamin B12 supplement daily.   3.  Family/social history: - Lives at home with her husband.  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  Non-smoker.  No family history of anemia.  Maternal grandmother died of metastatic cancer.  Brother died of anal cancer.   PLAN SUMMARY: >> Labs in 6 months (CBC/D, ferritin, iron/TIBC, B12, MMA) >> PHONE visit 1 week after labs    I discussed the assessment and treatment plan with the patient. The patient was provided an opportunity to ask questions and all were answered. The patient agreed with the plan and demonstrated an understanding of the instructions.   The patient was advised to call back or seek an in-person evaluation if the symptoms worsen or if the condition fails to improve as anticipated.  I provided 14 minutes of non-face-to-face time during this encounter.   Harriett Rush, PA-C 12/12/22 1:08 PM

## 2022-12-13 LAB — COPPER, SERUM: Copper: 95 ug/dL (ref 80–158)

## 2023-02-12 ENCOUNTER — Encounter: Payer: Self-pay | Admitting: Podiatry

## 2023-02-12 ENCOUNTER — Ambulatory Visit (INDEPENDENT_AMBULATORY_CARE_PROVIDER_SITE_OTHER): Payer: Medicare Other | Admitting: Podiatry

## 2023-02-12 DIAGNOSIS — M6740 Ganglion, unspecified site: Secondary | ICD-10-CM | POA: Diagnosis not present

## 2023-02-12 DIAGNOSIS — L723 Sebaceous cyst: Secondary | ICD-10-CM

## 2023-02-12 NOTE — Progress Notes (Signed)
Subjective:   Patient ID: Shannon Figueroa, female   DOB: 72 y.o.   MRN: [id number]   HPI Patient presents with a lot of pain on the bottom of the left foot of several months duration worse recently stating it did not start painful but has been painful for the last couple weeks.  She has noted a small cyst or lesion in the skin left that has been sore.  Patient does not smoke likes to be active   Review of Systems  All other systems reviewed and are negative.       Objective:  Physical Exam Vitals and nursing note reviewed.  Constitutional:      Appearance: She is well-developed.  Pulmonary:     Effort: Pulmonary effort is normal.  Musculoskeletal:        General: Normal range of motion.  Skin:    General: Skin is warm.  Neurological:     Mental Status: She is alert.     Neurovascular status intact muscle strength adequate range of motion adequate with inflammation pain of the left proximal metatarsal just proximal to the fourth metatarsal with a small movable subcutaneous mass measuring about 7 x 7 mm.  Good digital perfusion well-oriented x 3     Assessment:  Probability for small ganglionic or subcutaneous cyst formation left that is painful     Plan:  H&P reviewed I am going to try to shrink it I did sterile prep from the lateral side I injected around the area 3 mg Dexasone Kenalog 5 mg Xylocaine I advised on heat therapy and patient will be seen back to recheck may require excision if it were to stay enlarged or painful

## 2023-04-09 ENCOUNTER — Other Ambulatory Visit: Payer: Self-pay | Admitting: Physician Assistant

## 2023-04-09 DIAGNOSIS — Z1231 Encounter for screening mammogram for malignant neoplasm of breast: Secondary | ICD-10-CM

## 2023-05-19 ENCOUNTER — Ambulatory Visit
Admission: RE | Admit: 2023-05-19 | Discharge: 2023-05-19 | Disposition: A | Discharge status: Home / Self Care | Payer: Medicare Other | Source: Ambulatory Visit | Attending: Physician Assistant | Admitting: Physician Assistant

## 2023-05-19 DIAGNOSIS — Z1231 Encounter for screening mammogram for malignant neoplasm of breast: Secondary | ICD-10-CM

## 2023-05-20 ENCOUNTER — Ambulatory Visit (INDEPENDENT_AMBULATORY_CARE_PROVIDER_SITE_OTHER): Payer: Medicare Other | Admitting: Podiatry

## 2023-05-20 ENCOUNTER — Encounter: Payer: Self-pay | Admitting: Podiatry

## 2023-05-20 DIAGNOSIS — M6740 Ganglion, unspecified site: Secondary | ICD-10-CM | POA: Diagnosis not present

## 2023-05-20 MED ORDER — TRIAMCINOLONE ACETONIDE 10 MG/ML IJ SUSP
10.0000 mg | Freq: Once | INTRAMUSCULAR | Status: AC
Start: 2023-05-20 — End: 2023-05-20
  Administered 2023-05-20: 10 mg

## 2023-05-20 NOTE — Progress Notes (Signed)
Subjective:   Patient ID: Shannon Figueroa, female   DOB: 72 y.o.   MRN: [id number]   HPI Patient states she did great for 3 months and then she started to have some irritation underneath the outside of the left foot.  States that it did do well there was no lesion for that.  Of time F2 neurovascular status intact there is a small raised lesion plantar lateral aspect left foot proximal to the fourth metatarsal   ROS      Objective:  Physical Exam  Above description of lesion     Assessment:  Probability this still is some form of the cystic lesion and it did go away for 3 months completely and has reoccurred     Plan:  Reviewed that we would have to do an open cutting surgery to get this and patient does not want that currently and hopefully I can get it to strength and she can continue to use heat and will go away completely.  Sterile prep injected underneath the area 3 mg dexamethasone Kenalog 5 mg Xylocaine applied heat to the area reappoint to recheck all questions answered

## 2023-06-05 ENCOUNTER — Inpatient Hospital Stay: Payer: Medicare Other | Attending: Physician Assistant

## 2023-06-05 DIAGNOSIS — D649 Anemia, unspecified: Secondary | ICD-10-CM

## 2023-06-05 DIAGNOSIS — D509 Iron deficiency anemia, unspecified: Secondary | ICD-10-CM | POA: Diagnosis present

## 2023-06-05 DIAGNOSIS — E538 Deficiency of other specified B group vitamins: Secondary | ICD-10-CM

## 2023-06-05 LAB — CBC WITH DIFFERENTIAL/PLATELET
Abs Immature Granulocytes: 0.05 10*3/uL (ref 0.00–0.07)
Basophils Absolute: 0 10*3/uL (ref 0.0–0.1)
Basophils Relative: 0 %
Eosinophils Absolute: 0 10*3/uL (ref 0.0–0.5)
Eosinophils Relative: 0 %
HCT: 42.7 % (ref 36.0–46.0)
Hemoglobin: 13.9 g/dL (ref 12.0–15.0)
Immature Granulocytes: 1 %
Lymphocytes Relative: 25 %
Lymphs Abs: 1.3 10*3/uL (ref 0.7–4.0)
MCH: 29.8 pg (ref 26.0–34.0)
MCHC: 32.6 g/dL (ref 30.0–36.0)
MCV: 91.6 fL (ref 80.0–100.0)
Monocytes Absolute: 0.4 10*3/uL (ref 0.1–1.0)
Monocytes Relative: 8 %
Neutro Abs: 3.3 10*3/uL (ref 1.7–7.7)
Neutrophils Relative %: 66 %
Platelets: 144 10*3/uL — ABNORMAL LOW (ref 150–400)
RBC: 4.66 MIL/uL (ref 3.87–5.11)
RDW: 14.8 % (ref 11.5–15.5)
WBC: 5.1 10*3/uL (ref 4.0–10.5)
nRBC: 0 % (ref 0.0–0.2)

## 2023-06-05 LAB — IRON AND TIBC
Iron: 69 ug/dL (ref 28–170)
Saturation Ratios: 19 % (ref 10.4–31.8)
TIBC: 362 ug/dL (ref 250–450)
UIBC: 293 ug/dL

## 2023-06-05 LAB — VITAMIN B12: Vitamin B-12: 444 pg/mL (ref 180–914)

## 2023-06-05 LAB — FERRITIN: Ferritin: 66 ng/mL (ref 11–307)

## 2023-06-08 LAB — METHYLMALONIC ACID, SERUM: Methylmalonic Acid, Quantitative: 129 nmol/L (ref 0–378)

## 2023-06-10 NOTE — Progress Notes (Deleted)
Patient cancelled phone visit and rescheduled due to delay by provider. Patient not seen.

## 2023-06-11 ENCOUNTER — Inpatient Hospital Stay: Payer: Medicare Other | Admitting: Physician Assistant

## 2023-06-12 NOTE — Progress Notes (Signed)
This encounter was created in error - please disregard. Patient cancelled phone visit and rescheduled due to delay by provider. Patient not seen.

## 2023-07-07 NOTE — Progress Notes (Unsigned)
VIRTUAL VISIT via TELEPHONE NOTE [facility]   I connected with Shannon Figueroa  on 07/08/23 at 11:45 AM by telephone and verified that I am speaking with the correct person using two identifiers.  Location: Patient: Home Provider: [facility]   I discussed the limitations, risks, security and privacy concerns of performing an evaluation and management service by telephone and the availability of in person appointments. I also discussed with the patient that there may be a patient responsible charge related to this service. The patient expressed understanding and agreed to proceed.  REASON FOR VISIT:  Follow-up for iron deficiency anemia   PRIOR THERAPY: IV iron as needed (last given September 2022), PRBC transfusion (September 2022)   CURRENT THERAPY: Oral iron tablet   INTERVAL HISTORY:  Shannon Figueroa is contacted today for follow-up of her iron deficiency anemia.  She was last evaluated via telemedicine visit by Rojelio Brenner PA-C on 12/12/2022.   At today's visit, she reports feeling fairly well.  No recent hospitalizations, surgeries, or changes in baseline health status.  She has 100% energy and 100% appetite. She endorses that she is maintaining a stable weight.   She never noticed any obvious bleeding such as hematochezia, melena, or hematemesis.  She does report that her stool has been dark ever since she was started on iron pill.  Previously reported pica resolved after starting iron supplement.  She denies any fatigue, headaches, lightheadedness, syncope.  She does have some restless leg symptoms that have been ongoing for some time, rleieved with gabapentin.  She denies any chest pain or dyspnea on exertion.  She continues to take iron tablet daily with some mild constipation.  She is taking B12 tablet daily.  REVIEW OF SYSTEMS: Denies any complaints at this visit.  Review of Systems  Constitutional:  Negative for chills, diaphoresis, fever,  malaise/fatigue and weight loss.  Respiratory:  Negative for cough and shortness of breath.   Cardiovascular:  Negative for chest pain and palpitations.  Gastrointestinal:  Negative for abdominal pain, blood in stool, melena, nausea and vomiting.  Neurological:  Negative for dizziness and headaches.     PHYSICAL EXAM: (per limitations of virtual telephone visit)  The patient is alert and oriented x 3, exhibiting adequate mentation, good mood, and ability to speak in full sentences and execute sound judgement.  ASSESSMENT & PLAN:  1.  Severe iron deficiency anemia: - Reported fatigue, shortness of breath and dizziness for several months, found to have Hgb 5.5, ferritin 3, MCV 65 (08/26/2021). - Received 1 unit PRBC and 2 infusions of IV Feraheme in September 2022 - Colonoscopy (02/26/2021): Diverticulosis and polyps - EGD (10/09/2021): Gastric erosions and hiatal hernia, without active bleeding.  Per GI note, these Sheria Lang lesions were felt to be the source of chronic blood loss leading to iron deficiency anemia. - SPEP negative - She reports to be on antacids for the past several years for hiatal hernia and acid reflux. - She follows with [facility] (Dr. Russella Dar) - She has been taking daily iron tablet since November 2022. - She has not noticed any bright red blood per rectum or melena - Symptoms of pica, fatigue, DOE, chest pain, and lightheadedness have resolved - Most recent labs (06/05/2023): Hgb 13.9/MCV 91.6, platelets 144.  Ferritin 66, iron saturation 19%. - PLAN: Although ferritin is <100, she is asymptomatic and not anemic.  No indication for IV iron at this time. - Continue taking oral iron  tablet once daily. - Mild thrombocytopenia noted.  We will check nutritional panel and immature platelet fraction with next labs. - Repeat labs and RTC with OFFICE visit in 6 months.    2.  Vitamin B12 deficiency: - B12 level (10/04/2021) was low at 171 with normal methylmalonic  acid.  Copper and folic acid were normal. - She received B12 injection in November 2022. - She has been taking vitamin B12 1000 mcg tab since November 2022. - Most recent labs (06/05/2023): Normal vitamin B12 444, normal MMA - Additional labs from 12/05/2022 show normal homocystine, copper, folate.   - PLAN: Continue vitamin B12 supplement daily.   3.  Family/social history: - Lives at home with her husband.  She is retired from [employer].  Non-smoker.  No family history of anemia.  Maternal grandmother died of metastatic cancer.  Brother died of anal cancer.   PLAN SUMMARY: >> Labs in 6 months = CBC/D, ferritin, iron/TIBC, B12, MMA, folate, copper, CMP, immature platelet fraction >> OFFICE visit in 6 months (1 week after labs)  **Last OV was on 01/27/2022    I discussed the assessment and treatment plan with the patient. The patient was provided an opportunity to ask questions and all were answered. The patient agreed with the plan and demonstrated an understanding of the instructions.   The patient was advised to call back or seek an in-person evaluation if the symptoms worsen or if the condition fails to improve as anticipated.  I provided 22 minutes of non-face-to-face time during this encounter.  Carnella Guadalajara, PA-C 07/08/23 2:17 PM

## 2023-07-08 ENCOUNTER — Inpatient Hospital Stay: Payer: Medicare Other | Admitting: Physician Assistant

## 2023-07-08 ENCOUNTER — Encounter: Payer: Self-pay | Admitting: Physician Assistant

## 2023-07-08 ENCOUNTER — Inpatient Hospital Stay: Payer: Medicare Other | Attending: Physician Assistant | Admitting: Physician Assistant

## 2023-07-08 DIAGNOSIS — E538 Deficiency of other specified B group vitamins: Secondary | ICD-10-CM | POA: Diagnosis not present

## 2023-07-08 DIAGNOSIS — D696 Thrombocytopenia, unspecified: Secondary | ICD-10-CM | POA: Diagnosis not present

## 2023-07-08 DIAGNOSIS — D509 Iron deficiency anemia, unspecified: Secondary | ICD-10-CM | POA: Diagnosis not present

## 2023-09-18 IMAGING — MG MM DIGITAL SCREENING BILAT W/ TOMO AND CAD
6 of 10 series · 6 of 30 positions shown · non-contrast
Comparison: Previous exam(s).

CLINICAL DATA: Screening.

EXAM:
DIGITAL SCREENING BILATERAL MAMMOGRAM WITH TOMOSYNTHESIS AND CAD
TECHNIQUE: Bilateral screening digital craniocaudal and mediolateral oblique
mammograms were obtained. Bilateral screening digital breast
tomosynthesis was performed. The images were evaluated with
computer-aided detection.

[L MLO synth-2D]
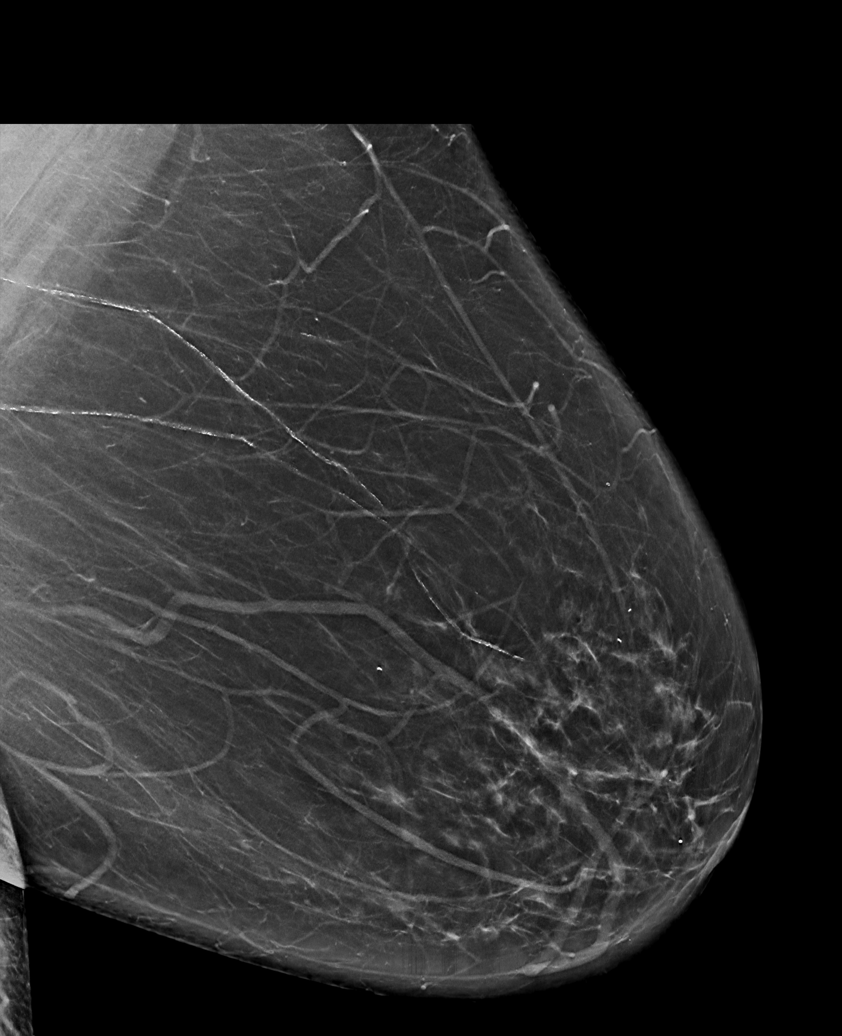

[R CV synth-2D]
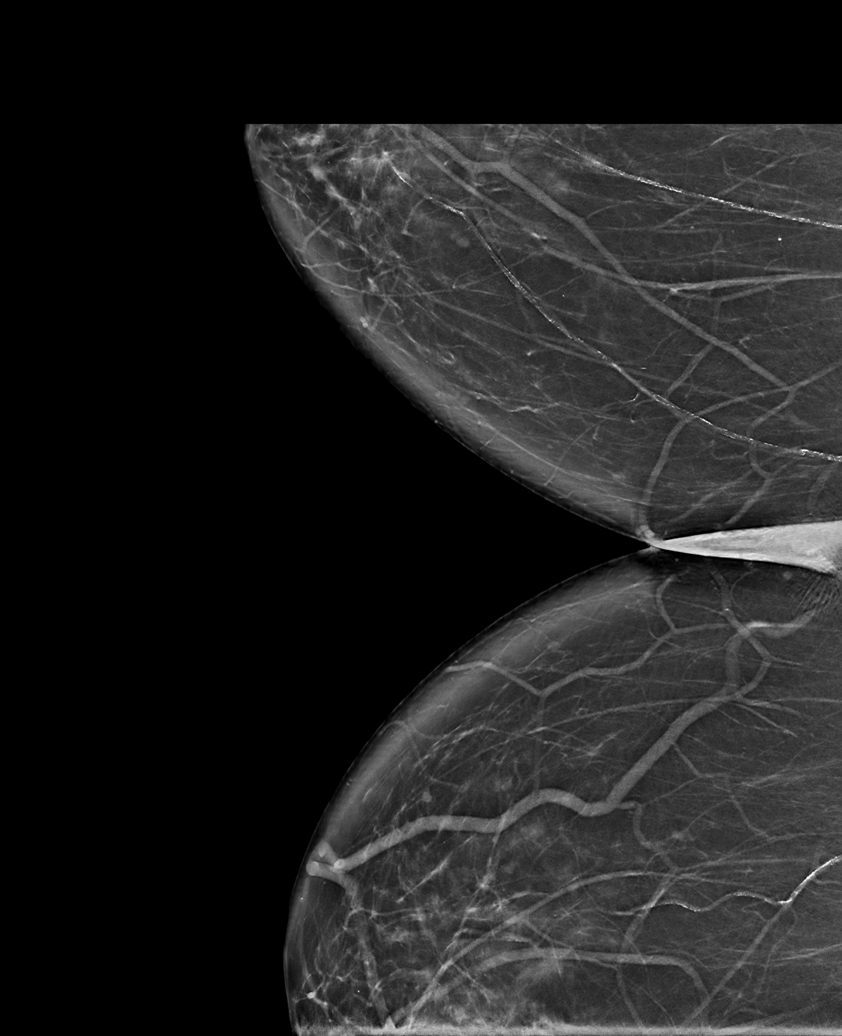

[R CC synth-2D]
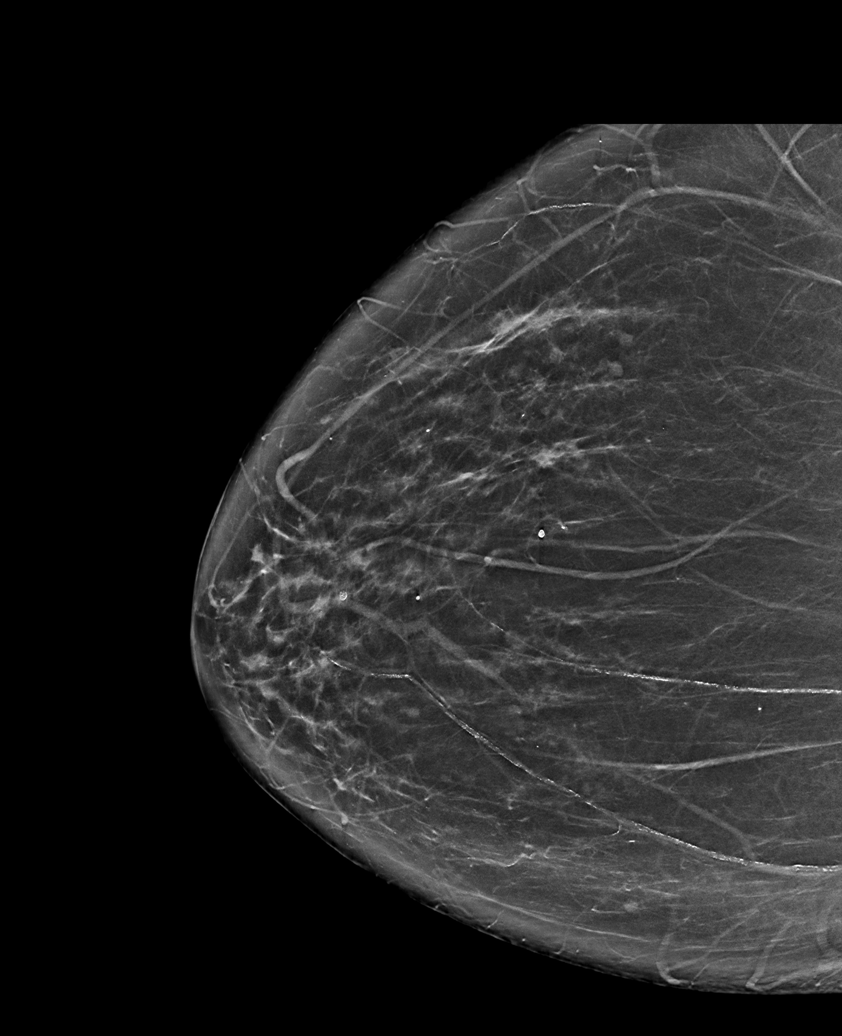

[L CC synth-2D]
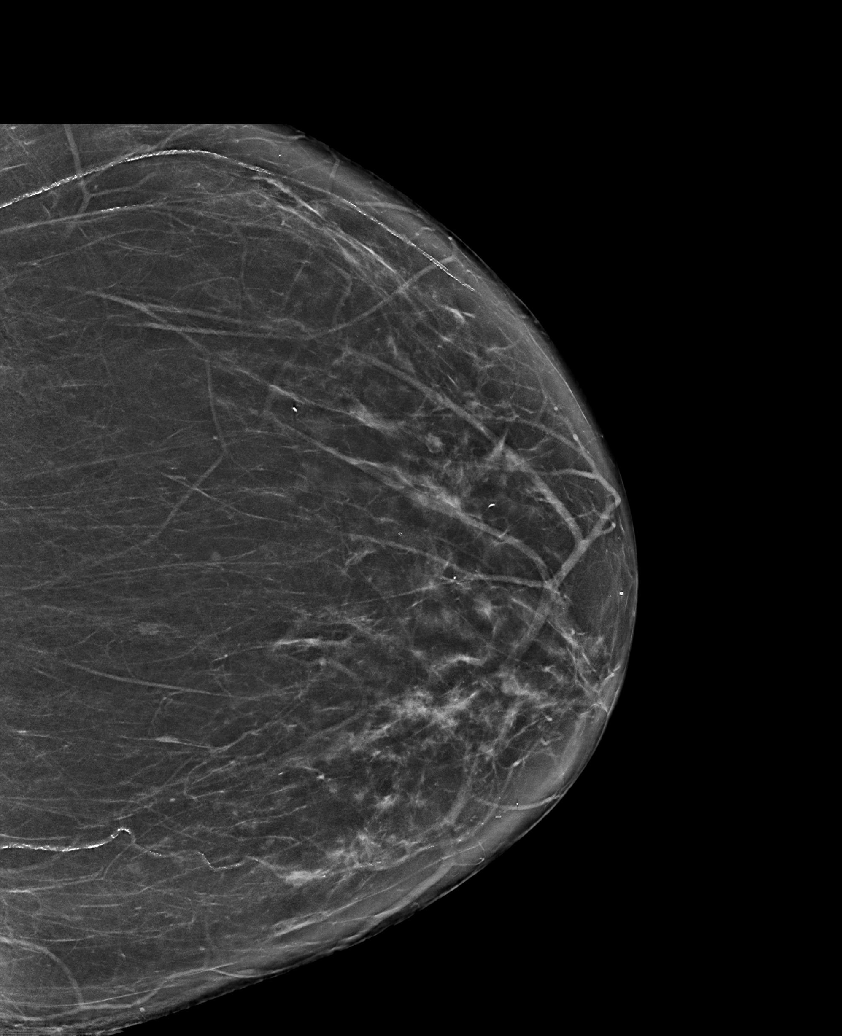

[R MLO synth-2D]
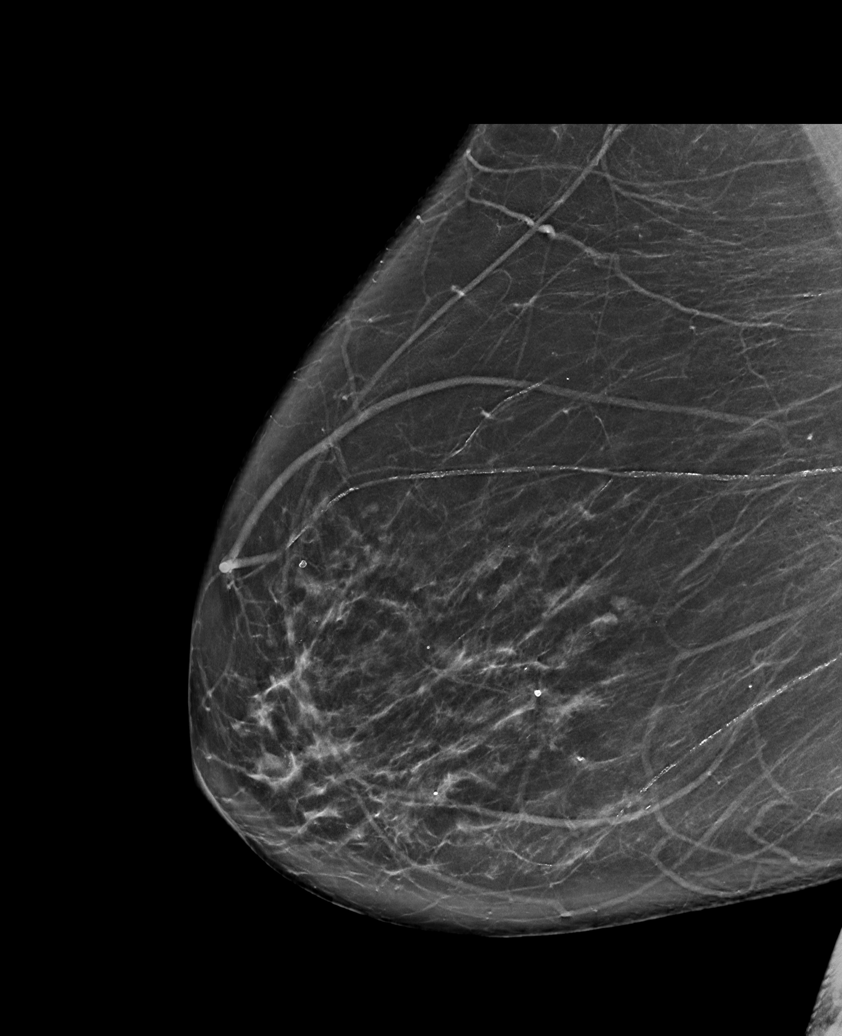

[R CC tomo · tomo slice 39/78.0]
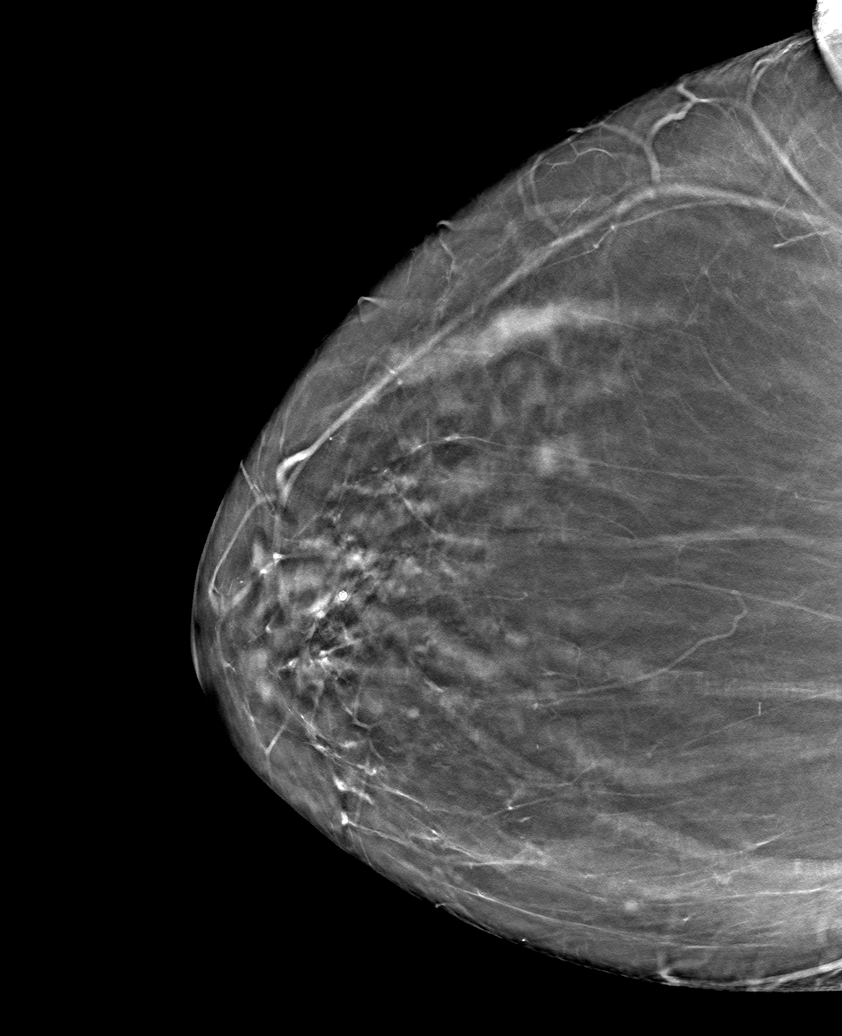

[6 of 30 positions shown; findings below may reference images not displayed]

ACR Breast Density Category b: There are scattered areas of
fibroglandular density.
FINDINGS: There are no findings suspicious for malignancy.
IMPRESSION: No mammographic evidence of malignancy. A result letter of this
screening mammogram will be mailed directly to the patient.

RECOMMENDATION:
Screening mammogram in one year. (Code:51-O-LD2)

BI-RADS CATEGORY  1: Negative.

## 2023-12-10 ENCOUNTER — Ambulatory Visit: Payer: Medicare Other | Admitting: Podiatry

## 2024-01-06 ENCOUNTER — Inpatient Hospital Stay: Payer: Medicare Other | Attending: Hematology

## 2024-01-06 DIAGNOSIS — D696 Thrombocytopenia, unspecified: Secondary | ICD-10-CM | POA: Insufficient documentation

## 2024-01-06 DIAGNOSIS — E538 Deficiency of other specified B group vitamins: Secondary | ICD-10-CM | POA: Insufficient documentation

## 2024-01-06 DIAGNOSIS — Z800 Family history of malignant neoplasm of digestive organs: Secondary | ICD-10-CM | POA: Diagnosis not present

## 2024-01-06 DIAGNOSIS — Z860100 Personal history of colon polyps, unspecified: Secondary | ICD-10-CM | POA: Insufficient documentation

## 2024-01-06 DIAGNOSIS — D509 Iron deficiency anemia, unspecified: Secondary | ICD-10-CM | POA: Insufficient documentation

## 2024-01-06 LAB — CBC WITH DIFFERENTIAL/PLATELET
Abs Immature Granulocytes: 0.01 10*3/uL (ref 0.00–0.07)
Basophils Absolute: 0 10*3/uL (ref 0.0–0.1)
Basophils Relative: 0 %
Eosinophils Absolute: 0 10*3/uL (ref 0.0–0.5)
Eosinophils Relative: 0 %
HCT: 43.9 % (ref 36.0–46.0)
Hemoglobin: 14.3 g/dL (ref 12.0–15.0)
Immature Granulocytes: 0 %
Lymphocytes Relative: 21 %
Lymphs Abs: 1.1 10*3/uL (ref 0.7–4.0)
MCH: 29.7 pg (ref 26.0–34.0)
MCHC: 32.6 g/dL (ref 30.0–36.0)
MCV: 91.1 fL (ref 80.0–100.0)
Monocytes Absolute: 0.4 10*3/uL (ref 0.1–1.0)
Monocytes Relative: 8 %
Neutro Abs: 3.7 10*3/uL (ref 1.7–7.7)
Neutrophils Relative %: 71 %
Platelets: 158 10*3/uL (ref 150–400)
RBC: 4.82 MIL/uL (ref 3.87–5.11)
RDW: 14.3 % (ref 11.5–15.5)
WBC: 5.3 10*3/uL (ref 4.0–10.5)
nRBC: 0 % (ref 0.0–0.2)

## 2024-01-06 LAB — COMPREHENSIVE METABOLIC PANEL
ALT: 14 U/L (ref 0–44)
AST: 16 U/L (ref 15–41)
Albumin: 4 g/dL (ref 3.5–5.0)
Alkaline Phosphatase: 66 U/L (ref 38–126)
Anion gap: 6 (ref 5–15)
BUN: 15 mg/dL (ref 8–23)
CO2: 29 mmol/L (ref 22–32)
Calcium: 9.1 mg/dL (ref 8.9–10.3)
Chloride: 103 mmol/L (ref 98–111)
Creatinine, Ser: 0.92 mg/dL (ref 0.44–1.00)
GFR, Estimated: >60 mL/min (ref >60)
Glucose, Bld: 111 mg/dL — ABNORMAL HIGH (ref 70–99)
Potassium: 4.4 mmol/L (ref 3.5–5.1)
Sodium: 138 mmol/L (ref 135–145)
Total Bilirubin: 0.9 mg/dL (ref 0.0–1.2)
Total Protein: 6.8 g/dL (ref 6.5–8.1)

## 2024-01-06 LAB — IRON AND TIBC
Iron: 68 ug/dL (ref 28–170)
Saturation Ratios: 19 % (ref 10.4–31.8)
TIBC: 364 ug/dL (ref 250–450)
UIBC: 296 ug/dL

## 2024-01-06 LAB — IMMATURE PLATELET FRACTION: Immature Platelet Fraction: 5.9 % (ref 1.2–8.6)

## 2024-01-06 LAB — VITAMIN B12: Vitamin B-12: 903 pg/mL (ref 180–914)

## 2024-01-06 LAB — FERRITIN: Ferritin: 69 ng/mL (ref 11–307)

## 2024-01-06 LAB — FOLATE: Folate: 19 ng/mL (ref >5.9)

## 2024-01-07 LAB — COPPER, SERUM: Copper: 102 ug/dL (ref 80–158)

## 2024-01-08 LAB — METHYLMALONIC ACID, SERUM: Methylmalonic Acid, Quantitative: 110 nmol/L (ref 0–378)

## 2024-01-12 NOTE — Progress Notes (Signed)
 [facility] [address], [place] [address]   CLINIC:  Medical Oncology/Hematology  PCP:  Shannon Pulling, MD [address] [place] [address] [phone]   REASON FOR VISIT:  Follow-up for iron deficiency anemia   PRIOR THERAPY: IV iron as needed (last given September 2022), PRBC transfusion (September 2022)   CURRENT THERAPY: Oral iron tablet  INTERVAL HISTORY:   Shannon Figueroa 73 y.o. female returns for routine follow-up of her iron deficiency anemia.  She was last evaluated via telemedicine visit by Shannon Dines PA-C on 07/08/2023.   At today's visit, she reports feeling well.  No recent hospitalizations, surgeries, or changes in baseline health status.  She has 100% energy and 100% appetite. She endorses that she is maintaining a stable weight.   She never noticed any obvious bleeding such as hematochezia, melena, or hematemesis.  She does report that her stool has been dark ever since she was started on iron pill.  Previously reported pica resolved after starting iron supplement.   She denies any fatigue, headaches, lightheadedness, syncope.  She does have some restless leg symptoms that have been ongoing for some time, relieved with gabapentin.  She denies any chest pain or dyspnea on exertion.   She is taking B12 tablet and iron tablet daily, experiencing some mild constipation.  ASSESSMENT & PLAN:  1.  Severe iron deficiency anemia: - Reported fatigue, shortness of breath and dizziness for several months, found to have Hgb 5.5, ferritin 3, MCV 65 (08/26/2021). - Received 1 unit PRBC and 2 infusions of IV Feraheme in September 2022 - Colonoscopy (02/26/2021): Diverticulosis and polyps - EGD (10/09/2021): Gastric erosions and hiatal hernia, without active bleeding.  Per GI note, these Shannon Figueroa lesions were felt to be the source of chronic blood loss leading to iron deficiency anemia. - SPEP negative - She reports to be on antacids for the past several years  for hiatal hernia and acid reflux. - She follows with [facility] (Dr. Sandrea Figueroa) - She has been taking daily iron tablet since November 2022. - She has not noticed any bright red blood per rectum or melena - Symptoms of pica, fatigue, DOE, chest pain, and lightheadedness have resolved - Most recent labs (01/06/2024): Hgb 14.3/MCV 91.1, platelets 158.  Ferritin 69, iron saturation 19%. - PLAN: Although ferritin is <100, she is asymptomatic and not anemic.  No indication for IV iron at this time. - Continue taking oral iron tablet once daily. - Repeat labs and RTC in 1 year. - Patient informed to contact us  sooner if she has any symptoms of recurrent anemia or worsening iron deficiency prior to her next visit.   2.  Vitamin B12 deficiency: - B12 level (10/04/2021) was low at 171 with normal methylmalonic acid.  Copper  and folic acid  were normal. - She received B12 injection in November 2022. - She has been taking vitamin B12 1000 mcg tab since November 2022. - Most recent labs (01/06/2024): Normal vitamin B12 903, normal MMA - PLAN: Continue vitamin B12 supplement daily.  Recheck B12/MMA annually.   3.  Mild intermittent thrombocytopenia - Mild intermittent thrombocytopenia starting in June 2023.  Platelets have remained >140 - Scan of ultrasound performed at [facility] on 01/23/2017 showed fatty infiltration of liver, but normal-sized spleen. - Hematology workup (01/06/2024): Normal immature platelet fraction 5.9%.  CMP normal.  Normal copper , B12, MMA, folate. - Most recent CBC (01/06/2024): Platelets normal at 158. - PLAN: No additional workup at this  time.  If any significant recurrent thrombocytopenia in the future, would consider checking abdominal imaging.    4.  Family/social history: - Lives at home with her husband.  She is retired from [employer].  Non-smoker.  No family history of anemia.  Maternal grandmother died of metastatic cancer.  Brother died of anal  cancer.   PLAN SUMMARY: >> Labs in 1 year = CBC/D, ferritin, iron/TIBC, B12, MMA >> OFFICE visit in 1 year (1 week after labs)   **Last OV was on 01/13/2024      REVIEW OF SYSTEMS: No acute complaints  Review of Systems  Constitutional:  Negative for appetite change, chills, diaphoresis, fatigue, fever and unexpected weight change.  HENT:   Negative for lump/mass and nosebleeds.   Eyes:  Negative for eye problems.  Respiratory:  Negative for cough, hemoptysis and shortness of breath.   Cardiovascular:  Negative for chest pain, leg swelling and palpitations.  Gastrointestinal:  Negative for abdominal pain, blood in stool, constipation, diarrhea, nausea and vomiting.  Genitourinary:  Negative for hematuria.   Skin: Negative.   Neurological:  Negative for dizziness, headaches and light-headedness.  Hematological:  Does not bruise/bleed easily.     PHYSICAL EXAM:  ECOG PERFORMANCE STATUS: 0 - Asymptomatic  Vitals:   01/13/24 0946  BP: (!) 156/76  Pulse: (!) 55  Resp: 20  Temp: 97.6 F (36.4 C)  SpO2: 98%   Filed Weights   01/13/24 0946  Weight: 236 lb 12.4 oz (107.4 kg)   Physical Exam Constitutional:      Appearance: Normal appearance. She is obese.  Cardiovascular:     Heart sounds: Normal heart sounds.  Pulmonary:     Breath sounds: Normal breath sounds.  Neurological:     General: No focal deficit present.     Mental Status: Mental status is at baseline.  Psychiatric:        Behavior: Behavior normal. Behavior is cooperative.     PAST MEDICAL/SURGICAL HISTORY:  Past Medical History:  Diagnosis Date   Allergy    Anemia    GERD (gastroesophageal reflux disease)    Glaucoma    Graves disease    Hiatal hernia    History of colon polyps    IDA (iron deficiency anemia)    Thyroid disease    Past Surgical History:  Procedure Laterality Date   ABDOMINAL HYSTERECTOMY     BACK SURGERY     BREAST EXCISIONAL BIOPSY Right    benign   COLONOSCOPY  2016    ovaries removed  2010   POLYPECTOMY     THYROIDECTOMY      SOCIAL HISTORY:  Social History   Socioeconomic History   Marital status: Married    Spouse name: Not on file   Number of children: Not on file   Years of education: Not on file   Highest education level: Not on file  Occupational History   Not on file  Tobacco Use   Smoking status: Never   Smokeless tobacco: Never  Vaping Use   Vaping status: Never Used  Substance and Sexual Activity   Alcohol use: No    Alcohol/week: 0.0 standard drinks of alcohol   Drug use: No   Sexual activity: Not on file  Other Topics Concern   Not on file  Social History Narrative   Not on file   Social Drivers of Health   Financial Resource Strain: Unknown (09/02/2021)   Received from [facility], [facility]   Overall Financial  Resource Strain (CARDIA)    Difficulty of Paying Living Expenses: Patient declined  Food Insecurity: Unknown (01/31/2022)   Received from [facility], [facility]   Hunger Vital Sign    Worried About Running Out of Food in the Last Year: Patient declined    Ran Out of Food in the Last Year: Patient declined  Transportation Needs: Unknown (01/31/2022)   Received from [facility], [facility]   [facility] - Transportation    Lack of Transportation (Medical): Patient declined    Lack of Transportation (Non-Medical): Patient declined  Physical Activity: Unknown (01/31/2022)   Received from [facility], [facility]   Exercise Vital Sign    Days of Exercise per Week: Patient declined    Minutes of Exercise per Session: Not on file  Stress: Unknown (01/31/2022)   Received from [facility], [facility] - Occupational Stress Questionnaire    Feeling of Stress : Patient declined  Social Connections: Unknown (05/13/2022)   Received from [facility], [facility]   Social Network    Social Network: Not on file  Intimate Partner Violence: Unknown (04/04/2022)    Received from [facility], [facility]   HITS    Physically Hurt: Not on file    Insult or Talk Down To: Not on file    Threaten Physical Harm: Not on file    Scream or Curse: Not on file    FAMILY HISTORY:  Family History  Problem Relation Age of Onset   Cancer Brother 55       anal cancer-   Rectal cancer Brother 23       started as anal cancer   Colon cancer Maternal Grandmother        unsure if styarted in colon- had metastasized all over her    Esophageal cancer Neg Hx    Colon polyps Neg Hx     CURRENT MEDICATIONS:  Outpatient Encounter Medications as of 01/13/2024  Medication Sig   Ferrous Sulfate (IRON) 325 (65 Fe) MG TABS Take by mouth.   fluticasone (FLONASE) 50 MCG/ACT nasal spray Place 1 spray into both nostrils daily.   gabapentin (NEURONTIN) 300 MG capsule Take 300 mg by mouth at bedtime.   levothyroxine (SYNTHROID) 88 MCG tablet Take 88 mcg by mouth in the morning.   Multiple Vitamins-Minerals (WOMENS MULTI VITAMIN & MINERAL PO) Take by mouth.   pantoprazole (PROTONIX) 40 MG tablet Take 40 mg by mouth 2 (two) times daily.   timolol (TIMOPTIC) 0.5 % ophthalmic solution Place 1 drop into both eyes in the morning.   vitamin B-12 (CYANOCOBALAMIN ) 1000 MCG tablet Take 1,000 mcg by mouth daily.   zolpidem (AMBIEN) 10 MG tablet Take 10 mg by mouth at bedtime. Patient takes as needed.   [DISCONTINUED] brimonidine (ALPHAGAN) 0.2 % ophthalmic solution Place 1 drop into both eyes 2 (two) times daily.   No facility-administered encounter medications on file as of 01/13/2024.    ALLERGIES:  Allergies  Allergen Reactions   Clindamycin/Lincomycin Other (See Comments)   Codeine      GI Upset   Dexlansoprazole Diarrhea   Esomeprazole Magnesium Diarrhea    nexium    Levofloxacin Other (See Comments)   Rabeprazole Sodium     REACTION: Headaches   Valium  [Diazepam ] Other (See Comments)   Penicillins Rash    LABORATORY DATA:  I have reviewed the labs as listed.   CBC    Component Value Date/Time   WBC 5.3 01/06/2024  1021   RBC 4.82 01/06/2024 1021   HGB 14.3 01/06/2024 1021   HCT 43.9 01/06/2024 1021   PLT 158 01/06/2024 1021   MCV 91.1 01/06/2024 1021   MCH 29.7 01/06/2024 1021   MCHC 32.6 01/06/2024 1021   RDW 14.3 01/06/2024 1021   LYMPHSABS 1.1 01/06/2024 1021   MONOABS 0.4 01/06/2024 1021   EOSABS 0.0 01/06/2024 1021   BASOSABS 0.0 01/06/2024 1021      Latest Ref Rng & Units 01/06/2024   10:21 AM 02/05/2017    9:38 AM  CMP  Glucose 70 - 99 mg/dL 960  454   BUN 8 - 23 mg/dL 15  11   Creatinine 0.98 - 1.00 mg/dL 1.19  1.47   Sodium 829 - 145 mmol/L 138  142   Potassium 3.5 - 5.1 mmol/L 4.4  3.9   Chloride 98 - 111 mmol/L 103  106   CO2 22 - 32 mmol/L 29  29   Calcium 8.9 - 10.3 mg/dL 9.1  9.3   Total Protein 6.5 - 8.1 g/dL 6.8  6.8   Total Bilirubin 0.0 - 1.2 mg/dL 0.9  0.7   Alkaline Phos 38 - 126 U/L 66  79   AST 15 - 41 U/L 16  14   ALT 0 - 44 U/L 14  12     DIAGNOSTIC IMAGING:  I have independently reviewed the relevant imaging and discussed with the patient.   WRAP UP:  All questions were answered. The patient knows to call the clinic with any problems, questions or concerns.  Medical decision making: Low  Time spent on visit: I spent 15 minutes counseling the patient face to face. The total time spent in the appointment was 22 minutes and more than 50% was on counseling.  Sonnie Dusky, PA-C  01/13/24 10:51 AM

## 2024-01-13 ENCOUNTER — Encounter: Payer: Self-pay | Admitting: Physician Assistant

## 2024-01-13 ENCOUNTER — Inpatient Hospital Stay (HOSPITAL_BASED_OUTPATIENT_CLINIC_OR_DEPARTMENT_OTHER): Payer: Medicare Other | Admitting: Physician Assistant

## 2024-01-13 VITALS — BP 156/76 | HR 55 | Temp 97.6°F | Resp 20 | Wt 236.8 lb

## 2024-01-13 DIAGNOSIS — E538 Deficiency of other specified B group vitamins: Secondary | ICD-10-CM | POA: Diagnosis not present

## 2024-01-13 DIAGNOSIS — D509 Iron deficiency anemia, unspecified: Secondary | ICD-10-CM | POA: Diagnosis not present

## 2024-01-13 NOTE — Patient Instructions (Signed)
 [facility] at [facility] Discharge Instructions  You were seen today by Sheril Dines PA-C for your iron deficiency anemia.  Your blood and iron levels looked great!  Continue taking daily iron supplement and daily B12 supplement.  Will recheck labs and see you for office visit in 1 year.  **If you have any symptoms of worsening iron deficiency (i.e. increased fatigue, headaches, lightheadedness, chest pain, shortness of breath) prior to your next visit, please call our office so that we can bring you in sooner if needed!  ** Thank you for trusting me with your healthcare!  I strive to provide all of my patients with quality care at each visit.  If you receive a survey for this visit, I would be so grateful to you for taking the time to provide feedback.  Thank you in advance!  ~ Hussein Macdougal                   Dr. Paulett Boros   &   Sheril Dines, PA-C   - - - - - - - - - - - - - - - - - -     Thank you for choosing [facility] at [facility] to provide your oncology and hematology care.  To afford each patient quality time with our provider, please arrive at least 15 minutes before your scheduled appointment time.   If you have a lab appointment with the Cancer Center please come in thru the Main Entrance and check in at the main information desk.  You need to re-schedule your appointment should you arrive 10 or more minutes late.  We strive to give you quality time with our providers, and arriving late affects you and other patients whose appointments are after yours.  Also, if you no show three or more times for appointments you may be dismissed from the clinic at the providers discretion.     Again, thank you for choosing [facility].  Our hope is that these requests will decrease the amount of time that you wait before being seen by our physicians.        _____________________________________________________________  Should you have questions after your visit to [facility], please contact our office at [phone] and follow the prompts.  Our office hours are 8:00 a.m. and 4:30 p.m. Monday - Friday.  Please note that voicemails left after 4:00 p.m. may not be returned until the following business day.  We are closed weekends and major holidays.  You do have access to a nurse 24-7, just call the main number to the clinic [phone] and do not press any options, hold on the line and a nurse will answer the phone.    For prescription refill requests, have your pharmacy contact our office and allow 72 hours.    Due to Covid, you will need to wear a mask upon entering the hospital. If you do not have a mask, a mask will be given to you at the Main Entrance upon arrival. For doctor visits, patients may have 1 support person age 44 or older with them. For treatment visits, patients can not have anyone with them due to social distancing guidelines and our immunocompromised population.

## 2024-07-15 NOTE — Progress Notes (Signed)
 07/18/2024 Shannon Figueroa [id number] May 11, 1951  Referring provider: Toribio Jerel MATSU, MD Primary GI doctor: Dr. Federico (Dr. 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 I discussed risks of EGD with patient today, including risk of sedation, bleeding or perforation. Patient provides understanding and gave verbal consent to proceed. -RUQ US  -gastroparesis diet given, likely contributing to her symptoms - FODMAP given, avoid milk  History of IDA thought secondary to [employer] with oncology last seen 01/13/2024 On oral iron and B12 with resolution 01/06/2024  HGB 14.3 MCV 91.1 Platelets 158 01/06/2024 Iron 68 Ferritin 69 B12 903 Recent Labs    01/06/24 1021  HGB 14.3  02/26/2021 colonoscopy no source of bleeding recall 7 years 09/2021 EGD medium-size hiatal hernia Ole erosions likely source of chronic blood loss and IDA  B12 deficiency 01/06/2024  B12 903  Personal history of colon polyps 02/26/2021  colonoscopy adequate bowel prep 2 TA polyps 7 to 8 mm transverse, tics right colon tics left colon, recall 7 years  Obesity  Body mass index is 36.55 kg/m.  -Patient has been advised to make an attempt to improve diet and exercise patterns to aid in weight loss. -Recommended diet heavy in fruits and veggies and low in animal meats, cheeses, and dairy products, appropriate calorie intake   Patient Care Team: Toribio Jerel MATSU, MD as PCP - General (Family Medicine) Rogers Hai, MD as Medical Oncologist (Hematology)  HISTORY OF PRESENT ILLNESS: 73 y.o. female with a past medical history listed below presents for evaluation of GERD.   Last seen in the office 11/26/2021 by Dr. Aneita for reflux and IDA.  Discussed the use of AI scribe software for clinical note transcription with the patient, who gave verbal consent to proceed.  History of Present Illness   Shannon Figueroa is a 73 year old female with a history of hiatal hernia and iron deficiency anemia who presents with worsening gastrointestinal symptoms. She was previously seen by Dr. Aneita for similar issues.  She has a history of iron deficiency anemia, which led to an endoscopy in November 2022. The endoscopy revealed a medium-sized hiatal hernia and Cameron erosions. She was started on oral iron and B12 supplements due to low levels and has been on Protonix (pantoprazole) 40 mg twice daily for years.  Despite the medication, she experiences episodes characterized by stomach bloating, tightness, audible churning, regurgitation, and excessive gas. These episodes occur two to three times a week, often after meals, and can last until 2 or 3 AM. She attempts to alleviate symptoms with over-the-counter antacids and Gaviscon, with limited success. Eating a  small apple sometimes helps, but not consistently. Her symptoms have worsened over the past year and a half, with episodes becoming more frequent. Certain foods, such as watermelon, ice  cream, and black grapes, exacerbate her symptoms, causing regurgitation and discomfort. She avoids ibuprofen and similar medications due to their effects on acid reflux and only uses extra strength Tylenol for pain relief.  She denies alcohol consumption, smoking, and eating spicy foods or red sauces. She experiences acid regurgitation without food content, and her stools are darker due to iron supplements but not tarry or black. She reports regular bowel movements.  Her family history includes her mother and daughter having hiatal hernias, and her daughter also has Barrett's esophagus. She has a history of gastroparesis diagnosed in 2011, with symptoms of slow digestion confirmed by a gastric emptying study. She manages her diet by eating two meals a day, but larger meals or certain foods can exacerbate her symptoms.        She  reports that she has never smoked. She has never used smokeless tobacco. She reports that she does not drink alcohol and does not use drugs.  RELEVANT GI HISTORY, IMAGING AND LABS: Results   Endoscopy: Medium-sized hiatal hernia and Ole erosions (October 2022)   Colonoscopy: No source of bleeding (March 2022)   Gastroparesis study: Delayed gastric emptying (2011)      CBC    Component Value Date/Time   WBC 5.3 01/06/2024 1021   RBC 4.82 01/06/2024 1021   HGB 14.3 01/06/2024 1021   HCT 43.9 01/06/2024 1021   PLT 158 01/06/2024 1021   MCV 91.1 01/06/2024 1021   MCH 29.7 01/06/2024 1021   MCHC 32.6 01/06/2024 1021   RDW 14.3 01/06/2024 1021   LYMPHSABS 1.1 01/06/2024 1021   MONOABS 0.4 01/06/2024 1021   EOSABS 0.0 01/06/2024 1021   BASOSABS 0.0 01/06/2024 1021   Recent Labs    01/06/24 1021  HGB 14.3    CMP     Component Value Date/Time   NA 138 01/06/2024 1021   K 4.4 01/06/2024 1021   CL 103 01/06/2024 1021   CO2 29 01/06/2024 1021   GLUCOSE 111 (H) 01/06/2024 1021   BUN 15 01/06/2024 1021   CREATININE 0.92 01/06/2024 1021   CALCIUM 9.1  01/06/2024 1021   PROT 6.8 01/06/2024 1021   ALBUMIN 4.0 01/06/2024 1021   AST 16 01/06/2024 1021   ALT 14 01/06/2024 1021   ALKPHOS 66 01/06/2024 1021   BILITOT 0.9 01/06/2024 1021   GFRNONAA >60 01/06/2024 1021      Latest Ref Rng & Units 01/06/2024   10:21 AM 02/05/2017    9:38 AM  Hepatic Function  Total Protein 6.5 - 8.1 g/dL 6.8  6.8   Albumin 3.5 - 5.0 g/dL 4.0  4.2   AST 15 - 41 U/L 16  14   ALT 0 - 44 U/L 14  12   Alk Phosphatase 38 - 126 U/L 66  79   Total Bilirubin 0.0 - 1.2 mg/dL 0.9  0.7       Current Medications:   Current Outpatient Medications (Endocrine & Metabolic):    levothyroxine (SYNTHROID) 88 MCG tablet, Take 88 mcg by mouth in the morning.   Current Outpatient Medications (Respiratory):    fluticasone (FLONASE) 50 MCG/ACT nasal spray, Place 1 spray into both nostrils daily.  Current Outpatient Medications (Analgesics):    meloxicam (MOBIC) 7.5 MG tablet, Take 7.5 mg by mouth as needed.  Current Outpatient Medications (  Hematological):    Ferrous Sulfate (IRON) 325 (65 Fe) MG TABS, Take by mouth.   vitamin B-12 (CYANOCOBALAMIN ) 1000 MCG tablet, Take 1,000 mcg by mouth daily.  Current Outpatient Medications (Other):    Alum Hydroxide-Mag Carbonate (GAVISCON PO), Take 1 Dose by mouth as needed.   Calcium Carbonate Antacid (TUMS EXTRA STRENGTH PO), Take 1-2 tablets by mouth as needed.   famotidine  (PEPCID ) 40 MG tablet, Take 1 tablet (40 mg total) by mouth at bedtime.   gabapentin (NEURONTIN) 300 MG capsule, Take 300 mg by mouth at bedtime.   Multiple Vitamins-Minerals (WOMENS MULTI VITAMIN & MINERAL PO), Take by mouth.   sucralfate  (CARAFATE ) 1 g tablet, Take 1 tablet (1 g total) by mouth 3 (three) times daily as needed (AB pain).   timolol (TIMOPTIC) 0.5 % ophthalmic solution, Place 1 drop into both eyes in the morning.   VOQUEZNA  10 MG TABS, Take 10 mg by mouth daily.   zolpidem (AMBIEN) 10 MG tablet, Take 10 mg by mouth at bedtime. Patient takes as  needed.  Medical History:  Past Medical History:  Diagnosis Date   Allergy    Anemia    GERD (gastroesophageal reflux disease)    Glaucoma    Graves disease    Hiatal hernia    History of colon polyps    IDA (iron deficiency anemia)    Thyroid disease    Allergies:  Allergies  Allergen Reactions   Clindamycin/Lincomycin Other (See Comments)   Codeine      GI Upset   Dexlansoprazole Diarrhea   Esomeprazole Magnesium Diarrhea    nexium    Levofloxacin Other (See Comments)   Rabeprazole Sodium     REACTION: Headaches   Valium  [Diazepam ] Other (See Comments)   Penicillins Rash     Surgical History:  She  has a past surgical history that includes Thyroidectomy; Abdominal hysterectomy; Back surgery; Polypectomy; ovaries removed (2010); Breast excisional biopsy (Right); and Colonoscopy (2016). Family History:  Her family history includes Cancer (age of onset: 32) in her brother; Colon cancer in her maternal grandmother; Rectal cancer (age of onset: 21) in her brother.  REVIEW OF SYSTEMS  : All other systems reviewed and negative except where noted in the History of Present Illness.  PHYSICAL EXAM: BP 130/78 (BP Location: Left Arm, Patient Position: Sitting, Cuff Size: Normal)   Pulse 68   Ht 5' 7 (1.702 m) Comment: height measured without shoes  Wt 233 lb 6 oz (105.9 kg)   BMI 36.55 kg/m  Physical Exam   GENERAL APPEARANCE: Well nourished, in no apparent distress. HEENT: No cervical lymphadenopathy, unremarkable thyroid, sclerae anicteric, conjunctiva pink. RESPIRATORY: Respiratory effort normal, breath sounds equal bilaterally without rales, rhonchi, or wheezing. Lungs clear to auscultation bilaterally. CARDIO: Regular rate and rhythm with no murmurs, rubs, or gallops, peripheral pulses intact. ABDOMEN: Soft, non-distended, active bowel sounds in all four quadrants, no tenderness to palpation, no rebound, no mass appreciated. RECTAL: Declines. MUSCULOSKELETAL: Full  range of motion, normal gait, without edema. SKIN: Dry, intact without rashes or lesions. No jaundice. NEURO: Alert, oriented, no focal deficits. PSYCH: Cooperative, normal mood and affect.      Alan JONELLE Coombs, PA-C 11:36 AM

## 2024-07-18 ENCOUNTER — Encounter: Payer: Self-pay | Admitting: Physician Assistant

## 2024-07-18 ENCOUNTER — Ambulatory Visit (INDEPENDENT_AMBULATORY_CARE_PROVIDER_SITE_OTHER): Admitting: Physician Assistant

## 2024-07-18 VITALS — BP 130/78 | HR 68 | Ht 67.0 in | Wt 233.4 lb

## 2024-07-18 DIAGNOSIS — K3184 Gastroparesis: Secondary | ICD-10-CM

## 2024-07-18 DIAGNOSIS — K449 Diaphragmatic hernia without obstruction or gangrene: Secondary | ICD-10-CM

## 2024-07-18 DIAGNOSIS — D509 Iron deficiency anemia, unspecified: Secondary | ICD-10-CM | POA: Diagnosis not present

## 2024-07-18 DIAGNOSIS — Z860100 Personal history of colon polyps, unspecified: Secondary | ICD-10-CM

## 2024-07-18 DIAGNOSIS — K219 Gastro-esophageal reflux disease without esophagitis: Secondary | ICD-10-CM

## 2024-07-18 MED ORDER — FAMOTIDINE 40 MG PO TABS: 40.0000 mg | ORAL_TABLET | Freq: Every day | ORAL | 0 refills | Status: DC

## 2024-07-18 MED ORDER — VOQUEZNA 10 MG PO TABS: 10.0000 mg | ORAL_TABLET | Freq: Every day | ORAL | 1 refills | Status: DC

## 2024-07-18 MED ORDER — SUCRALFATE 1 G PO TABS
1.0000 g | ORAL_TABLET | Freq: Three times a day (TID) | ORAL | 0 refills | Status: AC | PRN
Start: 2024-07-18 — End: ?

## 2024-07-18 NOTE — Patient Instructions (Addendum)
 You will be contacted by [facility] Scheduling in the next 2 days to arrange a Ultrasound.  The number on your caller ID will be [phone], please answer when they call.  If you have not heard from them in 2 days please call [phone] to schedule.    Stop the protonix 40 mg twice a day Will give voquezna  10 mg in the morning, can take pepcid  at night Avoid spicy and acidic foods Avoid fatty foods Limit your intake of coffee, tea, alcohol, and carbonated drinks Work to maintain a healthy weight Keep the head of the bed elevated at least 3 inches with blocks or a wedge pillow if you are having any nighttime symptoms Stay upright for 2 hours after eating Avoid meals and snacks three to four hours before bedtime  Sending a medication called Carafate , this mechanically coats your stomach, can take as needed for stomach pain Can cause constipation and darker stools. If the pill is too large to take you can make it more like a liquid by doing this, can dissolve it in warm water,  in small orange juice glass or shot glass and take it more as a liquid.   Reflux Gourmet Rescue- can try at night  It is an ALGINATE THERAPY which is the only intervention that works to safeguard the esophagus by creating a protective barrier that actually stops reflux from happening. -The general directions for use are as stated on the packaging: Take 1 teaspoon (5 ml), or more as needed or as directed by your physician, after meals and before bed. -These general directions address the most common times for reflux to occur, but our Rescue products may be taken anytime. Some individuals may take our product preemptively, when they know they will suffer from reflux, or as needed - when discomfort arises. (If taken around food, it should be consumed last.) -You do not have to take 1 teaspoon (5 ml) of the product. While one teaspoon (5ml) may be the perfect average amount to relieve reflux suffering in some,  others may require more or less. You may adjust the amount of Mint Chocolate Rescue and Vanilla Caramel Rescue to the lowest amount necessary to meet your individual needs to improve your quality of life. -You may dilute the product if it is too viscous for you to consume. Keep in mind, however, that the thickness of the product was formulated to provide optimal coating and protection of your throat and esophagus. Though diluting the product is possible, it may reduce the protective function and/or length of action. -This can be used in conjunction with reflux medications and lifestyle changes.  100% ALL-NATURAL  Paraben FREE, glycerin FREE, & potassium FREE  Made entirely from all-natural ingredients considered safe for children and during pregnancy  No known side effects  All-natural flavor Gluten FREE  Allergen FREE  Vegan  Can find more information here: NameSeizer.co.nz  Gastroparesis Gastroparesis is a condition in which food takes longer than normal to empty from the stomach.  This condition is also known as delayed gastric emptying. It is usually a long-term (chronic) condition.  What are the signs or symptoms? Symptoms of this condition include: Feeling full after eating very little or a loss of appetite. Nausea, vomiting, or heartburn. Bloating of your abdomen. Inconsistent blood sugar (glucose) levels on blood tests. Unexplained weight loss. Acid from the stomach coming up into the esophagus (gastroesophageal reflux). Sudden tightening (spasm) of the stomach, which can be painful. Symptoms may come and  go. Some people may not notice any symptoms.  What increases the risk? You are more likely to develop this condition if: You have certain disorders or diseases. These may include: An endocrine disorder. An eating disorder. Amyloidosis. Scleroderma. Parkinson's disease. Multiple sclerosis. Cancer or infection of the stomach or the  vagus nerve. You have had surgery on your stomach or vagus nerve. You take certain medicines. You are female.  Things you can do: Please do small frequent meals like 4-6 meals a day.  Eat and drink liquids at separate times.  Avoid high fiber foods, cook your vegetables, avoid high fat food.  Suggest spreading protein throughout the day (greek yogurt, glucerna, soft meat, milk, eggs) Choose soft foods that you can mash with a fork When you are more symptomatic, change to pureed foods foods and liquids.  Consider reading Living well with Gastroparesis by [facility] Check out this link to a diet online https://my.GroupJournal.fr  You have been scheduled for an endoscopy. Please follow written instructions given to you at your visit today.  If you use inhalers (even only as needed), please bring them with you on the day of your procedure.  If you take any of the following medications, they will need to be adjusted prior to your procedure:   DO NOT TAKE 7 DAYS PRIOR TO TEST- Trulicity (dulaglutide) Ozempic, Wegovy (semaglutide) Mounjaro (tirzepatide) Bydureon Bcise (exanatide extended release)  DO NOT TAKE 1 DAY PRIOR TO YOUR TEST Rybelsus (semaglutide) Adlyxin (lixisenatide) Victoza (liraglutide) Byetta (exanatide) ___________________________________________________________________________    Due to recent changes in healthcare laws, you may see the results of your imaging and laboratory studies on MyChart before your provider has had a chance to review them.  We understand that in some cases there may be results that are confusing or concerning to you. Not all laboratory results come back in the same time frame and the provider may be waiting for multiple results in order to interpret others.  Please give us  48 hours in order for your provider to thoroughly review all the results  before contacting the office for clarification of your results.    I appreciate the  opportunity to care for you  Thank You   [facility]

## 2024-07-18 NOTE — Progress Notes (Signed)
 I agree with the assessment and plan as outlined by Ms. Steffanie Dunn.

## 2024-07-19 ENCOUNTER — Telehealth: Payer: Self-pay

## 2024-07-19 ENCOUNTER — Other Ambulatory Visit (HOSPITAL_COMMUNITY): Payer: Self-pay

## 2024-07-19 NOTE — Telephone Encounter (Signed)
 Pharmacy Patient Advocate Encounter   Received notification from CoverMyMeds that prior authorization for Voquezna  10MG  tablets is required/requested.   Insurance verification completed.   The patient is insured through [facility] Medicare Part D .   Per test claim: PA required; PA submitted to above mentioned insurance via CoverMyMeds Key/confirmation #/EOC [facility] Status is pending

## 2024-07-20 ENCOUNTER — Ambulatory Visit (AMBULATORY_SURGERY_CENTER): Admitting: Internal Medicine

## 2024-07-20 ENCOUNTER — Encounter: Payer: Self-pay | Admitting: Internal Medicine

## 2024-07-20 VITALS — BP 122/68 | HR 60 | Temp 97.4°F | Resp 13 | Ht 67.0 in | Wt 233.0 lb

## 2024-07-20 DIAGNOSIS — K2950 Unspecified chronic gastritis without bleeding: Secondary | ICD-10-CM

## 2024-07-20 DIAGNOSIS — K5710 Diverticulosis of small intestine without perforation or abscess without bleeding: Secondary | ICD-10-CM

## 2024-07-20 DIAGNOSIS — K449 Diaphragmatic hernia without obstruction or gangrene: Secondary | ICD-10-CM

## 2024-07-20 DIAGNOSIS — K219 Gastro-esophageal reflux disease without esophagitis: Secondary | ICD-10-CM

## 2024-07-20 MED ORDER — SODIUM CHLORIDE 0.9 % IV SOLN: 500.0000 mL | Freq: Once | INTRAVENOUS | Status: DC

## 2024-07-20 NOTE — Telephone Encounter (Signed)
 Pharmacy Patient Advocate Encounter  Received notification from [employer] Medicare Part D that Prior Authorization for Voquezna  10MG  tablets has been APPROVED from 07-19-2024 to 08-19-2024   PA #/Case ID/Reference #: [id number]

## 2024-07-20 NOTE — Op Note (Addendum)
 [facility] Patient Name: Shannon Figueroa Procedure Date: 07/20/2024 8:10 AM MRN: [id number] Endoscopist: Rosario Estefana Kidney , , [id number] Age: 73 Referring MD:  Date of Birth: [birth year] Gender: Female Account #: [id number] Procedure:                Upper GI endoscopy Indications:              Heartburn Medicines:                Monitored Anesthesia Care Procedure:                Pre-Anesthesia Assessment:                           - Prior to the procedure, a History and Physical                            was performed, and patient medications and                            allergies were reviewed. The patient's tolerance of                            previous anesthesia was also reviewed. The risks                            and benefits of the procedure and the sedation                            options and risks were discussed with the patient.                            All questions were answered, and informed consent                            was obtained. Prior Anticoagulants: The patient has                            taken no anticoagulant or antiplatelet agents. ASA                            Grade Assessment: II - A patient with mild systemic                            disease. After reviewing the risks and benefits,                            the patient was deemed in satisfactory condition to                            undergo the procedure.                           After obtaining informed consent, the endoscope was  passed under direct vision. Throughout the                            procedure, the patient's blood pressure, pulse, and                            oxygen saturations were monitored continuously. The                            GIF [id number] #[id number] was introduced through the                            mouth, and advanced to the second part of duodenum.                            The upper GI endoscopy was accomplished  without                            difficulty. The patient tolerated the procedure                            well. Scope In: Scope Out: Findings:                 The examined esophagus was normal.                           A 7 cm hiatal hernia was present.                           Localized inflammation characterized by congestion                            (edema), erythema and nodularity was found in the                            gastric body. Biopsies were taken with a cold                            forceps for histology.                           A non-bleeding diverticulum was found in the                            duodenal bulb. Complications:            No immediate complications. Estimated Blood Loss:     Estimated blood loss was minimal. Impression:               - Normal esophagus.                           - 7 cm hiatal hernia.                           - Gastritis. Biopsied.                           -  Non-bleeding duodenal diverticulum. Recommendation:           - Discharge patient to home (with escort).                           - Continue pantoprazole 40 mg BID until Voquezna  is                            able to be approved                           - Can trial Reflux Gourmet TID after meals to see                            if this helps keep reflux symptoms controlled.                           - May need to consider hiatal hernia repair and                            antireflux surgery in the future.                           - Encouraged her to eat smaller more frequent meals                            that are low fat and low fiber to help with her                            gastroparesis.                           - Coud consider low dose Reglan in the future to                            see if this helps with gastroparesis.                           - Await pathology results.                           - Return to GI clinic in 2-3 months.                            - The findings and recommendations were discussed                            with the patient. Dr Estefana Federico Rosario Estefana Federico,  07/20/2024 9:05:15 AM

## 2024-07-20 NOTE — Patient Instructions (Addendum)
 Continue pantoprazole 40 mg BID Can trial Reflux Gourmet TID (Over the counter medication) after meals to see if that helps keep reflux symptoms controlled. May need to consider hiatal hernia repair and antireflux surgery in the future. Follow up with GI office at scheduled appointment   YOU HAD AN ENDOSCOPIC PROCEDURE TODAY AT THE [facility]:   Refer to the procedure report that was given to you for any specific questions about what was found during the examination.  If the procedure report does not answer your questions, please call your gastroenterologist to clarify.  If you requested that your care partner not be given the details of your procedure findings, then the procedure report has been included in a sealed envelope for you to review at your convenience later.  YOU SHOULD EXPECT: Some feelings of bloating in the abdomen. Passage of more gas than usual.  Walking can help get rid of the air that was put into your GI tract during the procedure and reduce the bloating. If you had a lower endoscopy (such as a colonoscopy or flexible sigmoidoscopy) you may notice spotting of blood in your stool or on the toilet paper. If you underwent a bowel prep for your procedure, you may not have a normal bowel movement for a few days.  Please Note:  You might notice some irritation and congestion in your nose or some drainage.  This is from the oxygen used during your procedure.  There is no need for concern and it should clear up in a day or so.  SYMPTOMS TO REPORT IMMEDIATELY:  Following upper endoscopy (EGD)  Vomiting of blood or coffee ground material  New chest pain or pain under the shoulder blades  Painful or persistently difficult swallowing  New shortness of breath  Fever of 100F or higher  Black, tarry-looking stools  For urgent or emergent issues, a gastroenterologist can be reached at any hour by calling [phone]. Do not use MyChart messaging for urgent concerns.     DIET:  We do recommend a small meal at first, but then you may proceed to your regular diet.  Drink plenty of fluids but you should avoid alcoholic beverages for 24 hours.  ACTIVITY:  You should plan to take it easy for the rest of today and you should NOT DRIVE or use heavy machinery until tomorrow (because of the sedation medicines used during the test).    FOLLOW UP: Our staff will call the number listed on your records the next business day following your procedure.  We will call around 7:15- 8:00 am to check on you and address any questions or concerns that you may have regarding the information given to you following your procedure. If we do not reach you, we will leave a message.     If any biopsies were taken you will be contacted by phone or by letter within the next 1-3 weeks.  Please call us  at [phone] if you have not heard about the biopsies in 3 weeks.    SIGNATURES/CONFIDENTIALITY: You and/or your care partner have signed paperwork which will be entered into your electronic medical record.  These signatures attest to the fact that that the information above on your After Visit Summary has been reviewed and is understood.  Full responsibility of the confidentiality of this discharge information lies with you and/or your care-partner.

## 2024-07-20 NOTE — Progress Notes (Signed)
 Called to room to assist during endoscopic procedure.  Patient ID and intended procedure confirmed with present staff. Received instructions for my participation in the procedure from the performing physician.

## 2024-07-20 NOTE — Progress Notes (Signed)
 Vss nad trans to pacu

## 2024-07-20 NOTE — Progress Notes (Signed)
 GASTROENTEROLOGY PROCEDURE H&P NOTE   Primary Care Physician: Toribio Jerel MATSU, MD    Reason for Procedure:   GERD  Plan:    EGD  Patient is appropriate for endoscopic procedure(s) in the ambulatory ([facility]) setting.  The nature of the procedure, as well as the risks, benefits, and alternatives were carefully and thoroughly reviewed with the patient. Ample time for discussion and questions allowed. The patient understood, was satisfied, and agreed to proceed.     HPI: Shannon Figueroa is a 73 y.o. female who presents for EGD for evaluation of GERD.  Patient was most recently seen in the Gastroenterology Clinic on 07/18/24.  No interval change in medical history since that appointment. Please refer to that note for full details regarding GI history and clinical presentation.   Past Medical History:  Diagnosis Date   Allergy    Anemia    Blood transfusion without reported diagnosis    for IDA   GERD (gastroesophageal reflux disease)    Glaucoma    Graves disease    Hiatal hernia    History of colon polyps    IDA (iron deficiency anemia)    Thyroid disease     Past Surgical History:  Procedure Laterality Date   ABDOMINAL HYSTERECTOMY     BACK SURGERY     BREAST EXCISIONAL BIOPSY Right    benign   COLONOSCOPY  2016   ovaries removed  2010   POLYPECTOMY     THYROIDECTOMY      Prior to Admission medications   Medication Sig Start Date End Date Taking? Authorizing Provider  Ferrous Sulfate (IRON) 325 (65 Fe) MG TABS Take by mouth.   Yes [provider]  fluticasone (FLONASE) 50 MCG/ACT nasal spray Place 1 spray into both nostrils daily.   Yes [provider]  gabapentin (NEURONTIN) 300 MG capsule Take 300 mg by mouth at bedtime. 06/28/21  Yes [provider]  levothyroxine (SYNTHROID) 88 MCG tablet Take 88 mcg by mouth in the morning. 05/23/21  Yes [provider]  timolol (TIMOPTIC) 0.5 % ophthalmic solution Place 1 drop into both eyes in  the morning. 01/13/21  Yes [provider]  vitamin B-12 (CYANOCOBALAMIN ) 1000 MCG tablet Take 1,000 mcg by mouth daily.   Yes [provider]  Alum Hydroxide-Mag Carbonate (GAVISCON PO) Take 1 Dose by mouth as needed.    [provider]  Calcium Carbonate Antacid (TUMS EXTRA STRENGTH PO) Take 1-2 tablets by mouth as needed.    [provider]  famotidine  (PEPCID ) 40 MG tablet Take 1 tablet (40 mg total) by mouth at bedtime. 07/18/24   Craig Alan SAUNDERS, PA-C  meloxicam (MOBIC) 7.5 MG tablet Take 7.5 mg by mouth as needed.    [provider]  Multiple Vitamins-Minerals (WOMENS MULTI VITAMIN & MINERAL PO) Take by mouth.    [provider]  sucralfate  (CARAFATE ) 1 g tablet Take 1 tablet (1 g total) by mouth 3 (three) times daily as needed (AB pain). Patient not taking: Reported on 07/20/2024 07/18/24   Craig Alan SAUNDERS, PA-C  VOQUEZNA  10 MG TABS Take 10 mg by mouth daily. Patient not taking: Reported on 07/20/2024 07/18/24 10/16/24  Craig Alan SAUNDERS, PA-C  zolpidem (AMBIEN) 10 MG tablet Take 10 mg by mouth at bedtime. Patient takes as needed. 03/29/21   [provider]    Current Outpatient Medications  Medication Sig Dispense Refill   Ferrous Sulfate (IRON) 325 (65 Fe) MG TABS Take by mouth.  fluticasone (FLONASE) 50 MCG/ACT nasal spray Place 1 spray into both nostrils daily.     gabapentin (NEURONTIN) 300 MG capsule Take 300 mg by mouth at bedtime.     levothyroxine (SYNTHROID) 88 MCG tablet Take 88 mcg by mouth in the morning.     timolol (TIMOPTIC) 0.5 % ophthalmic solution Place 1 drop into both eyes in the morning.     vitamin B-12 (CYANOCOBALAMIN ) 1000 MCG tablet Take 1,000 mcg by mouth daily.     Alum Hydroxide-Mag Carbonate (GAVISCON PO) Take 1 Dose by mouth as needed.     Calcium Carbonate Antacid (TUMS EXTRA STRENGTH PO) Take 1-2 tablets by mouth as needed.     famotidine  (PEPCID ) 40 MG tablet Take 1 tablet (40 mg total) by  mouth at bedtime. 90 tablet 0   meloxicam (MOBIC) 7.5 MG tablet Take 7.5 mg by mouth as needed.     Multiple Vitamins-Minerals (WOMENS MULTI VITAMIN & MINERAL PO) Take by mouth.     sucralfate  (CARAFATE ) 1 g tablet Take 1 tablet (1 g total) by mouth 3 (three) times daily as needed (AB pain). (Patient not taking: Reported on 07/20/2024) 90 tablet 0   VOQUEZNA  10 MG TABS Take 10 mg by mouth daily. (Patient not taking: Reported on 07/20/2024) 90 tablet 1   zolpidem (AMBIEN) 10 MG tablet Take 10 mg by mouth at bedtime. Patient takes as needed.     Current Facility-Administered Medications  Medication Dose Route Frequency Provider Last Rate Last Admin   0.9 %  sodium chloride  infusion  500 mL Intravenous Once Federico Rosario BROCKS, MD        Allergies as of 07/20/2024 - Review Complete 07/20/2024  Allergen Reaction Noted   Clindamycin/lincomycin Other (See Comments) 10/31/2015   Codeine Nausea And Vomiting 04/29/2010   Dexlansoprazole Diarrhea    Esomeprazole magnesium Diarrhea 10/14/2010   Levofloxacin Other (See Comments) 10/31/2015   Penicillins Rash 04/29/2010   Rabeprazole sodium Other (See Comments)    Valium  [diazepam ] Other (See Comments) 10/31/2015    Family History  Problem Relation Age of Onset   Cancer Brother 1       anal cancer-   Rectal cancer Brother 60       started as anal cancer   Colon cancer Maternal Grandmother        unsure if styarted in colon- had metastasized all over her    Esophageal cancer Neg Hx    Colon polyps Neg Hx    Stomach cancer Neg Hx     Social History   Socioeconomic History   Marital status: Married    Spouse name: Not on file   Number of children: Not on file   Years of education: Not on file   Highest education level: Not on file  Occupational History   Not on file  Tobacco Use   Smoking status: Never   Smokeless tobacco: Never  Vaping Use   Vaping status: Never Used  Substance and Sexual Activity   Alcohol use: No    Alcohol/week:  0.0 standard drinks of alcohol   Drug use: No   Sexual activity: Not on file  Other Topics Concern   Not on file  Social History Narrative   Not on file   Social Drivers of Health   Financial Resource Strain: Unknown (09/02/2021)   Received from [facility]   Overall Financial Resource Strain (CARDIA)    Difficulty of Paying Living Expenses: Patient declined  Food Insecurity: Unknown (01/31/2022)  Received from [facility]   Hunger Vital Sign    Within the past 12 months, you worried that your food would run out before you got the money to buy more.: Patient declined    Within the past 12 months, the food you bought just didn't last and you didn't have money to get more.: Patient declined  Transportation Needs: Unknown (01/31/2022)   Received from [facility] - Transportation    Lack of Transportation (Medical): Patient declined    Lack of Transportation (Non-Medical): Patient declined  Physical Activity: Unknown (01/31/2022)   Received from [facility]   Exercise Vital Sign    On average, how many days per week do you engage in moderate to strenuous exercise (like a brisk walk)?: Patient declined    Minutes of Exercise per Session: Not on file  Stress: Unknown (01/31/2022)   Received from [facility] - Occupational Stress Questionnaire    Feeling of Stress : Patient declined  Social Connections: Unknown (05/13/2022)   Received from [facility]   Social Network    Social Network: Not on file  Intimate Partner Violence: Unknown (04/04/2022)   Received from [facility]   HITS    Physically Hurt: Not on file    Insult or Talk Down To: Not on file    Threaten Physical Harm: Not on file    Scream or Curse: Not on file    Physical Exam: Vital signs in last 24 hours: BP (!) 144/64   Pulse (!) 56   Temp (!) 97.4 F (36.3 C)   Resp 16   Ht 5' 7 (1.702 m)   Wt 233 lb (105.7 kg)   SpO2 97%   BMI 36.49 kg/m  GEN:  NAD EYE: Sclerae anicteric ENT: MMM CV: Non-tachycardic Pulm: No increased WOB GI: Soft NEURO:  Alert & Oriented   Estefana Kidney, MD [facility]   07/20/2024 8:41 AM

## 2024-07-21 ENCOUNTER — Telehealth: Payer: Self-pay

## 2024-07-21 NOTE — Telephone Encounter (Signed)
 Left message

## 2024-07-22 ENCOUNTER — Ambulatory Visit: Payer: Self-pay | Admitting: Internal Medicine

## 2024-07-22 LAB — SURGICAL PATHOLOGY

## 2024-07-26 ENCOUNTER — Ambulatory Visit (HOSPITAL_COMMUNITY)
Admission: RE | Admit: 2024-07-26 | Discharge: 2024-07-26 | Disposition: A | Discharge status: Home / Self Care | Source: Ambulatory Visit | Attending: Physician Assistant | Admitting: Physician Assistant

## 2024-07-26 DIAGNOSIS — K219 Gastro-esophageal reflux disease without esophagitis: Secondary | ICD-10-CM | POA: Diagnosis present

## 2024-08-01 ENCOUNTER — Ambulatory Visit: Payer: Self-pay | Admitting: Physician Assistant

## 2024-08-01 DIAGNOSIS — K8020 Calculus of gallbladder without cholecystitis without obstruction: Secondary | ICD-10-CM

## 2024-08-17 ENCOUNTER — Ambulatory Visit (HOSPITAL_COMMUNITY)
Admission: RE | Admit: 2024-08-17 | Discharge: 2024-08-17 | Disposition: A | Discharge status: Home / Self Care | Source: Ambulatory Visit | Attending: Physician Assistant | Admitting: Physician Assistant

## 2024-08-17 ENCOUNTER — Other Ambulatory Visit: Payer: Self-pay | Admitting: Physician Assistant

## 2024-08-17 ENCOUNTER — Ambulatory Visit: Payer: Self-pay | Admitting: Physician Assistant

## 2024-08-17 DIAGNOSIS — K8020 Calculus of gallbladder without cholecystitis without obstruction: Secondary | ICD-10-CM

## 2024-08-17 MED ORDER — MORPHINE SULFATE (PF) 2 MG/ML IV SOLN
INTRAVENOUS | Status: AC
  Filled 2024-08-17: qty 2

## 2024-08-17 MED ORDER — MORPHINE SULFATE (PF) 2 MG/ML IV SOLN
3.0000 mg | Freq: Once | INTRAVENOUS | Status: AC
  Administered 2024-08-17: 3 mg via INTRAVENOUS

## 2024-08-17 MED ORDER — TECHNETIUM TC 99M MEBROFENIN IV KIT
5.2200 | PACK | Freq: Once | INTRAVENOUS | Status: AC
  Administered 2024-08-17: 5.22 via INTRAVENOUS

## 2024-08-17 NOTE — Telephone Encounter (Signed)
 Inbound call from pt requesting to have the nurse or Alan Coombs to call her back in regards to her results and what she is needing to do next. Please advise.

## 2024-08-19 ENCOUNTER — Ambulatory Visit: Payer: Self-pay | Admitting: General Surgery

## 2024-08-19 NOTE — H&P (Signed)
 Chief Complaint: New Consultation       History of Present Illness: Shannon Figueroa is a 73 y.o. female who is seen today as an office consultation at the request of Dr. Craig for evaluation of New Consultation .   History of Present Illness Shannon Figueroa is a 73 year old female who presents with abdominal pain and bloating.   She experiences bloating and pain under her ribs, especially after eating, along with nausea and gas. The pain is not limited to specific foods. Ultrasound and HIDA scan show gallstones, but the HIDA scan lacks a functional percentage. She is worried about the risk of gallbladder rupture or gangrene.   Her medical history includes a laparoscopic oophorectomy in 2010 and management of Graves' disease with thyroid medication. She also uses eye drops for glaucoma and takes Protonix 40 mg twice daily for acid reflux. She has no history of hypertension, diabetes, myocardial infarction, or cerebrovascular accidents.         Review of Systems: A complete review of systems was obtained from the patient.  I have reviewed this information and discussed as appropriate with the patient.  See HPI as well for other ROS.   Review of Systems  Constitutional:  Negative for fever.  HENT:  Negative for congestion.   Eyes:  Negative for blurred vision.  Respiratory:  Negative for cough, shortness of breath and wheezing.   Cardiovascular:  Negative for chest pain and palpitations.  Gastrointestinal:  Positive for abdominal pain, nausea and vomiting. Negative for heartburn.  Genitourinary:  Negative for dysuria.  Musculoskeletal:  Negative for myalgias.  Skin:  Negative for rash.  Neurological:  Negative for dizziness and headaches.  Psychiatric/Behavioral:  Negative for depression and suicidal ideas.   All other systems reviewed and are negative.       Medical History: Past Medical History Past Medical History: Diagnosis Date  Anemia    GERD (gastroesophageal reflux  disease)    Glaucoma (increased eye pressure)    Thyroid disease         Problem List There is no problem list on file for this patient.     Past Surgical History Past Surgical History: Procedure Laterality Date  HYSTERECTOMY      SPINE SURGERY      THYROIDECTOMY TOTAL          Allergies Allergies Allergen Reactions  Cefprozil Unknown  Codeine Nausea And Vomiting     GI Upset  Diazepam  Other (See Comments)  Levofloxacin Other (See Comments)  Penicillins Rash  Clindamycin Other (See Comments)  Dexlansoprazole Diarrhea      Medications Ordered Prior to Encounter Current Outpatient Medications on File Prior to Visit Medication Sig Dispense Refill  famotidine  (PEPCID ) 40 MG tablet Take 40 mg by mouth at bedtime      ferrous sulfate 325 (65 FE) MG tablet Take 325 mg by mouth daily with breakfast      fluticasone propionate (FLONASE) 50 mcg/actuation nasal spray Place into one nostril      gabapentin (NEURONTIN) 300 MG capsule Take 600 mg by mouth at bedtime      levothyroxine (SYNTHROID) 88 MCG tablet TAKE 1 TABLET BY MOUTH DAILY  TAKE BY ITSELF ON AN EMPTY  STOMACH      meloxicam (MOBIC) 7.5 MG tablet Take 7.5 mg by mouth once daily WITH FOOD      nystatin (MYCOSTATIN) 100,000 unit/gram cream APPLY A THIN LAYER OF CREAM TOPICALLY THREE TIMES DAILY FOR 10 DAYS  pantoprazole (PROTONIX) 40 MG DR tablet Take 40 mg by mouth 2 (two) times daily      PROCTO-MED HC 2.5 % rectal cream APPLY 1 APPLICATION OF CREAM RECTALLY THREE TIMES DAILY FOR 7 DAYS      sucralfate  (CARAFATE ) 1 gram tablet TAKE 1 TABLET BY MOUTH THREE TIMES DAILY AS NEEDED FOR ABDOMINAL PAIN      timoloL maleate (TIMOPTIC) 0.5 % ophthalmic solution        traZODone (DESYREL) 50 MG tablet TAKE 1 TABLET BY MOUTH ONCE DAILY AT NIGHT AT BEDTIME      VOQUEZNA  10 mg Tab          No current facility-administered medications on file prior to visit.      Family History Family History Problem Relation Age of  Onset  Diabetes Father    Myocardial Infarction (Heart attack) Father    Diabetes Brother        Tobacco Use History Social History    Tobacco Use Smoking Status Never Smokeless Tobacco Never      Social History Social History    Socioeconomic History  Marital status: Married Tobacco Use  Smoking status: Never  Smokeless tobacco: Never Vaping Use  Vaping status: Never Used Substance and Sexual Activity  Alcohol use: Never  Drug use: Never    Social Drivers of Manufacturing engineer Strain: Unknown (09/02/2021)   Received from [facility]   Overall Financial Resource Strain (CARDIA)    Difficulty of Paying Living Expenses: Patient declined Food Insecurity: Unknown (01/31/2022)   Received from [facility]   Hunger Vital Sign    Within the past 12 months, you worried that your food would run out before you got the money to buy more.: Patient declined    Within the past 12 months, the food you bought just didn't last and you didn't have money to get more.: Patient declined Transportation Needs: Unknown (01/31/2022)   Received from [facility] - Transportation    Lack of Transportation (Medical): Patient declined    Lack of Transportation (Non-Medical): Patient declined Physical Activity: Unknown (01/31/2022)   Received from [facility]   Exercise Vital Sign    On average, how many days per week do you engage in moderate to strenuous exercise (like a brisk walk)?: Patient declined Stress: Unknown (01/31/2022)   Received from [facility] of Occupational Health - Occupational Stress Questionnaire    Feeling of Stress : Patient declined   Received from [employer]   Social Network Housing Stability: Unknown (08/19/2024)   Housing Stability Vital Sign    Homeless in the Last Year: No      Objective:     Vitals:   08/19/24 0931 08/19/24 0932 BP: (!) 147/81   Pulse: 77   Temp: 36.7 C (98 F)   SpO2: 97%    Weight: (!) 103.6 kg (228 lb 6.4 oz)   Height: 171.5 cm (5' 7.5)   PainSc:     3   Body mass index is 35.24 kg/m.   Physical Exam Constitutional:      Appearance: Normal appearance.  HENT:     Head: Normocephalic and atraumatic.     Mouth/Throat:     Mouth: Mucous membranes are moist.     Pharynx: Oropharynx is clear.  Eyes:     General: No scleral icterus.    Pupils: Pupils are equal, round, and reactive to light.  Cardiovascular:  Rate and Rhythm: Normal rate and regular rhythm.     Pulses: Normal pulses.     Heart sounds: No murmur heard.    No friction rub. No gallop.  Pulmonary:     Effort: Pulmonary effort is normal. No respiratory distress.     Breath sounds: Normal breath sounds. No stridor.  Abdominal:     General: Abdomen is flat.  Musculoskeletal:        General: No swelling.  Skin:    General: Skin is warm.  Neurological:     General: No focal deficit present.     Mental Status: She is alert and oriented to person, place, and time. Mental status is at baseline.  Psychiatric:        Mood and Affect: Mood normal.        Thought Content: Thought content normal.        Judgment: Judgment normal.       Assessment and Plan: Diagnoses and all orders for this visit:   Symptomatic cholelithiasis     Shannon Figueroa is a 73 y.o. female    1.  We will proceed to the OR for a lap cholecystectomy. 2. All risks and benefits were discussed with the patient to generally include: infection, bleeding, possible need for post op ERCP, damage to the bile ducts, and bile leak. Alternatives were offered and described.  All questions were answered and the patient voiced understanding of the procedure and wishes to proceed at this point with a laparoscopic cholecystectomy           No follow-ups on file.   Lynda Leos, MD, [facility], [place] [facility]

## 2024-08-19 NOTE — H&P (View-Only) (Signed)
 Chief Complaint: New Consultation       History of Present Illness: Shannon Figueroa is a 73 y.o. female who is seen today as an office consultation at the request of Dr. Craig for evaluation of New Consultation .   History of Present Illness Shannon Figueroa is a 73 year old female who presents with abdominal pain and bloating.   She experiences bloating and pain under her ribs, especially after eating, along with nausea and gas. The pain is not limited to specific foods. Ultrasound and HIDA scan show gallstones, but the HIDA scan lacks a functional percentage. She is worried about the risk of gallbladder rupture or gangrene.   Her medical history includes a laparoscopic oophorectomy in 2010 and management of Graves' disease with thyroid medication. She also uses eye drops for glaucoma and takes Protonix 40 mg twice daily for acid reflux. She has no history of hypertension, diabetes, myocardial infarction, or cerebrovascular accidents.         Review of Systems: A complete review of systems was obtained from the patient.  I have reviewed this information and discussed as appropriate with the patient.  See HPI as well for other ROS.   Review of Systems  Constitutional:  Negative for fever.  HENT:  Negative for congestion.   Eyes:  Negative for blurred vision.  Respiratory:  Negative for cough, shortness of breath and wheezing.   Cardiovascular:  Negative for chest pain and palpitations.  Gastrointestinal:  Positive for abdominal pain, nausea and vomiting. Negative for heartburn.  Genitourinary:  Negative for dysuria.  Musculoskeletal:  Negative for myalgias.  Skin:  Negative for rash.  Neurological:  Negative for dizziness and headaches.  Psychiatric/Behavioral:  Negative for depression and suicidal ideas.   All other systems reviewed and are negative.       Medical History: Past Medical History Past Medical History: Diagnosis Date  Anemia    GERD (gastroesophageal reflux  disease)    Glaucoma (increased eye pressure)    Thyroid disease         Problem List There is no problem list on file for this patient.     Past Surgical History Past Surgical History: Procedure Laterality Date  HYSTERECTOMY      SPINE SURGERY      THYROIDECTOMY TOTAL          Allergies Allergies Allergen Reactions  Cefprozil Unknown  Codeine Nausea And Vomiting     GI Upset  Diazepam  Other (See Comments)  Levofloxacin Other (See Comments)  Penicillins Rash  Clindamycin Other (See Comments)  Dexlansoprazole Diarrhea      Medications Ordered Prior to Encounter Current Outpatient Medications on File Prior to Visit Medication Sig Dispense Refill  famotidine  (PEPCID ) 40 MG tablet Take 40 mg by mouth at bedtime      ferrous sulfate 325 (65 FE) MG tablet Take 325 mg by mouth daily with breakfast      fluticasone propionate (FLONASE) 50 mcg/actuation nasal spray Place into one nostril      gabapentin (NEURONTIN) 300 MG capsule Take 600 mg by mouth at bedtime      levothyroxine (SYNTHROID) 88 MCG tablet TAKE 1 TABLET BY MOUTH DAILY  TAKE BY ITSELF ON AN EMPTY  STOMACH      meloxicam (MOBIC) 7.5 MG tablet Take 7.5 mg by mouth once daily WITH FOOD      nystatin (MYCOSTATIN) 100,000 unit/gram cream APPLY A THIN LAYER OF CREAM TOPICALLY THREE TIMES DAILY FOR 10 DAYS  pantoprazole (PROTONIX) 40 MG DR tablet Take 40 mg by mouth 2 (two) times daily      PROCTO-MED HC 2.5 % rectal cream APPLY 1 APPLICATION OF CREAM RECTALLY THREE TIMES DAILY FOR 7 DAYS      sucralfate  (CARAFATE ) 1 gram tablet TAKE 1 TABLET BY MOUTH THREE TIMES DAILY AS NEEDED FOR ABDOMINAL PAIN      timoloL maleate (TIMOPTIC) 0.5 % ophthalmic solution        traZODone (DESYREL) 50 MG tablet TAKE 1 TABLET BY MOUTH ONCE DAILY AT NIGHT AT BEDTIME      VOQUEZNA  10 mg Tab          No current facility-administered medications on file prior to visit.      Family History Family History Problem Relation Age of  Onset  Diabetes Father    Myocardial Infarction (Heart attack) Father    Diabetes Brother        Tobacco Use History Social History    Tobacco Use Smoking Status Never Smokeless Tobacco Never      Social History Social History    Socioeconomic History  Marital status: Married Tobacco Use  Smoking status: Never  Smokeless tobacco: Never Vaping Use  Vaping status: Never Used Substance and Sexual Activity  Alcohol use: Never  Drug use: Never    Social Drivers of Manufacturing engineer Strain: Unknown (09/02/2021)   Received from [facility]   Overall Financial Resource Strain (CARDIA)    Difficulty of Paying Living Expenses: Patient declined Food Insecurity: Unknown (01/31/2022)   Received from [facility]   Hunger Vital Sign    Within the past 12 months, you worried that your food would run out before you got the money to buy more.: Patient declined    Within the past 12 months, the food you bought just didn't last and you didn't have money to get more.: Patient declined Transportation Needs: Unknown (01/31/2022)   Received from [facility] - Transportation    Lack of Transportation (Medical): Patient declined    Lack of Transportation (Non-Medical): Patient declined Physical Activity: Unknown (01/31/2022)   Received from [facility]   Exercise Vital Sign    On average, how many days per week do you engage in moderate to strenuous exercise (like a brisk walk)?: Patient declined Stress: Unknown (01/31/2022)   Received from [facility] - Occupational Stress Questionnaire    Feeling of Stress : Patient declined   Received from [employer]   Social Network Housing Stability: Unknown (08/19/2024)   Housing Stability Vital Sign    Homeless in the Last Year: No      Objective:     Vitals:   08/19/24 0931 08/19/24 0932 BP: (!) 147/81   Pulse: 77   Temp: 36.7 C (98 F)   SpO2: 97%    Weight: (!) 103.6 kg (228 lb 6.4 oz)   Height: 171.5 cm (5' 7.5)   PainSc:     3   Body mass index is 35.24 kg/m.   Physical Exam Constitutional:      Appearance: Normal appearance.  HENT:     Head: Normocephalic and atraumatic.     Mouth/Throat:     Mouth: Mucous membranes are moist.     Pharynx: Oropharynx is clear.  Eyes:     General: No scleral icterus.    Pupils: Pupils are equal, round, and reactive to light.  Cardiovascular:  Rate and Rhythm: Normal rate and regular rhythm.     Pulses: Normal pulses.     Heart sounds: No murmur heard.    No friction rub. No gallop.  Pulmonary:     Effort: Pulmonary effort is normal. No respiratory distress.     Breath sounds: Normal breath sounds. No stridor.  Abdominal:     General: Abdomen is flat.  Musculoskeletal:        General: No swelling.  Skin:    General: Skin is warm.  Neurological:     General: No focal deficit present.     Mental Status: She is alert and oriented to person, place, and time. Mental status is at baseline.  Psychiatric:        Mood and Affect: Mood normal.        Thought Content: Thought content normal.        Judgment: Judgment normal.       Assessment and Plan: Diagnoses and all orders for this visit:   Symptomatic cholelithiasis     Shannon Figueroa is a 73 y.o. female    1.  We will proceed to the OR for a lap cholecystectomy. 2. All risks and benefits were discussed with the patient to generally include: infection, bleeding, possible need for post op ERCP, damage to the bile ducts, and bile leak. Alternatives were offered and described.  All questions were answered and the patient voiced understanding of the procedure and wishes to proceed at this point with a laparoscopic cholecystectomy           No follow-ups on file.   Lynda Leos, MD, [facility], [place] [facility]

## 2024-08-23 ENCOUNTER — Other Ambulatory Visit (HOSPITAL_COMMUNITY): Payer: Self-pay

## 2024-08-23 ENCOUNTER — Telehealth: Payer: Self-pay

## 2024-08-23 NOTE — Telephone Encounter (Signed)
 Pharmacy Patient Advocate Encounter   Received notification from CoverMyMeds that prior authorization for Voquezna  10MG  tablets is required/requested.   Insurance verification completed.   The patient is insured through [facility] .   Per test claim: PA required; PA submitted to above mentioned insurance via Latent Key/confirmation #/EOC [id number] Status is pending

## 2024-08-24 NOTE — Telephone Encounter (Signed)
 Pt made aware that that Prior Authorization for Voquezna  10MG  tablets has been APPROVED from 08-23-2024 to 09-23-2024

## 2024-08-24 NOTE — Telephone Encounter (Signed)
 Pharmacy Patient Advocate Encounter  Received notification from [facility] [employer] that Prior Authorization for Voquezna  10MG  tablets has been APPROVED from 08-23-2024 to 09-23-2024   PA #/Case ID/Reference #: [id number]

## 2024-09-01 NOTE — Pre-Procedure Instructions (Signed)
 Surgical Instructions   Your procedure is scheduled on Tuesday, September 06, 2024. Report to [facility] Main Entrance A at 10:15 A.M., then check in with the Admitting office. Any questions or running late day of surgery: call [phone]  Questions prior to your surgery date: call [phone], Monday-Friday, 8am-4pm. If you experience any cold or flu symptoms such as cough, fever, chills, shortness of breath, etc. between now and your scheduled surgery, please notify us  at the above number.     Remember:  Do not eat after midnight the night before your surgery   You may drink clear liquids until 9:15am the morning of your surgery.   Clear liquids allowed are: Water, Non-Citrus Juices (without pulp), Carbonated Beverages, Clear Tea (no milk, honey, etc.), Black Coffee Only (NO MILK, CREAM OR POWDERED CREAMER of any kind), and Gatorade.  Patient Instructions  The night before surgery:  No food after midnight. ONLY clear liquids after midnight  The day of surgery (if you do NOT have diabetes):  Drink ONE (1) Pre-Surgery Clear Ensure by 9:15am the morning of surgery. Drink in one sitting. Do not sip.  This drink was given to you during your hospital  pre-op appointment visit.  Nothing else to drink after completing the  Pre-Surgery Clear Ensure.          If you have questions, please contact your surgeon's office.     Take these medicines the morning of surgery with A SIP OF WATER : Fluticasone (Flonase) nasal spray Levothyroxine (Synthroid) Pantoprazole (Protonix) Timolol (Timoptic) eye drops   May take these medicines IF NEEDED: Acetaminophen (Tylenol)   One week prior to surgery, STOP taking any Aspirin (unless otherwise instructed by your surgeon) Aleve, Naproxen, Ibuprofen, Motrin, Advil, Goody's, BC's, all herbal medications, fish oil, and non-prescription vitamins.                     Do NOT Smoke (Tobacco/Vaping) for 24 hours prior to your procedure.  If  you use a CPAP at night, you may bring your mask/headgear for your overnight stay.   You will be asked to remove any contacts, glasses, piercing's, hearing aid's, dentures/partials prior to surgery. Please bring cases for these items if needed.    Patients discharged the day of surgery will not be allowed to drive home, and someone needs to stay with them for 24 hours.  SURGICAL WAITING ROOM VISITATION Patients may have no more than 2 support people in the waiting area - these visitors may rotate.   Pre-op nurse will coordinate an appropriate time for 1 ADULT support person, who may not rotate, to accompany patient in pre-op.  Children under the age of 25 must have an adult with them who is not the patient and must remain in the main waiting area with an adult.  If the patient needs to stay at the hospital during part of their recovery, the visitor guidelines for inpatient rooms apply.  Please refer to the [facility] website for the visitor guidelines for any additional information.   If you received a COVID test during your pre-op visit  it is requested that you wear a mask when out in public, stay away from anyone that may not be feeling well and notify your surgeon if you develop symptoms. If you have been in contact with anyone that has tested positive in the last 10 days please notify you surgeon.      Pre-operative CHG Bathing Instructions   You can play  a key role in reducing the risk of infection after surgery. Your skin needs to be as free of germs as possible. You can reduce the number of germs on your skin by washing with CHG (chlorhexidine gluconate) soap before surgery. CHG is an antiseptic soap that kills germs and continues to kill germs even after washing.   DO NOT use if you have an allergy to chlorhexidine/CHG or antibacterial soaps. If your skin becomes reddened or irritated, stop using the CHG and notify one of our RNs at [phone].              TAKE A SHOWER THE  NIGHT BEFORE SURGERY AND THE DAY OF SURGERY    Please keep in mind the following:  DO NOT shave, including legs and underarms, 48 hours prior to surgery.   You may shave your face before/day of surgery.  Place clean sheets on your bed the night before surgery Use a clean washcloth (not used since being washed) for each shower. DO NOT sleep with pet's night before surgery.  CHG Shower Instructions:  Wash your face and private area with normal soap. If you choose to wash your hair, wash first with your normal shampoo.  After you use shampoo/soap, rinse your hair and body thoroughly to remove shampoo/soap residue.  Turn the water OFF and apply half the bottle of CHG soap to a CLEAN washcloth.  Apply CHG soap ONLY FROM YOUR NECK DOWN TO YOUR TOES (washing for 3-5 minutes)  DO NOT use CHG soap on face, private areas, open wounds, or sores.  Pay special attention to the area where your surgery is being performed.  If you are having back surgery, having someone wash your back for you may be helpful. Wait 2 minutes after CHG soap is applied, then you may rinse off the CHG soap.  Pat dry with a clean towel  Put on clean pajamas    Additional instructions for the day of surgery: DO NOT APPLY any lotions, deodorants, cologne, or perfumes.   Do not wear jewelry or makeup Do not wear nail polish, gel polish, artificial nails, or any other type of covering on natural nails (fingers and toes) Do not bring valuables to the hospital. [facility] is not responsible for valuables/personal belongings. Put on clean/comfortable clothes.  Please brush your teeth.  Ask your nurse before applying any prescription medications to the skin.

## 2024-09-02 ENCOUNTER — Encounter (HOSPITAL_COMMUNITY): Payer: Self-pay

## 2024-09-02 ENCOUNTER — Other Ambulatory Visit: Payer: Self-pay

## 2024-09-02 ENCOUNTER — Encounter (HOSPITAL_COMMUNITY)
Admission: RE | Admit: 2024-09-02 | Discharge: 2024-09-02 | Disposition: A | Discharge status: Home / Self Care | Source: Ambulatory Visit | Attending: General Surgery | Admitting: General Surgery

## 2024-09-02 VITALS — BP 123/72 | HR 66 | Temp 98.3°F | Resp 18 | Ht 67.5 in | Wt 227.6 lb

## 2024-09-02 DIAGNOSIS — Z01818 Encounter for other preprocedural examination: Secondary | ICD-10-CM | POA: Diagnosis present

## 2024-09-02 DIAGNOSIS — Z01812 Encounter for preprocedural laboratory examination: Secondary | ICD-10-CM | POA: Insufficient documentation

## 2024-09-02 HISTORY — DX: Other complications of anesthesia, initial encounter: T88.59XA

## 2024-09-02 HISTORY — DX: Other specified postprocedural states: Z98.890

## 2024-09-02 LAB — CBC
HCT: 45 % (ref 36.0–46.0)
Hemoglobin: 14.1 g/dL (ref 12.0–15.0)
MCH: 29 pg (ref 26.0–34.0)
MCHC: 31.3 g/dL (ref 30.0–36.0)
MCV: 92.4 fL (ref 80.0–100.0)
Platelets: 172 K/uL (ref 150–400)
RBC: 4.87 MIL/uL (ref 3.87–5.11)
RDW: 14 % (ref 11.5–15.5)
WBC: 6.2 K/uL (ref 4.0–10.5)
nRBC: 0 % (ref 0.0–0.2)

## 2024-09-02 NOTE — Progress Notes (Signed)
 PCP - Jolee Elsie Hamilton, PA   Honey Jerel MATSU, MD)  Cardiologist -   PPM/ICD - denies Device Orders - n/a Rep Notified - n/a  Chest x-ray -  EKG -  Stress Test - 03-22-21 ECHO - 03-20-21 Cardiac Cath -   Sleep Study - denies CPAP - n/a  Dm -denies  Blood Thinner Instructions:denies Aspirin Instructions:denies  ERAS Protcol -clear liquids until 9:15 PRE-SURGERY Ensure    COVID TEST- n/a   Anesthesia review: no  Patient denies shortness of breath, fever, cough and chest pain at PAT appointment   All instructions explained to the patient, with a verbal understanding of the material. Patient agrees to go over the instructions while at home for a better understanding. Patient also instructed to self quarantine after being tested for COVID-19. The opportunity to ask questions was provided.

## 2024-09-02 NOTE — Progress Notes (Signed)
 Surgical Instructions     Your procedure is scheduled on Tuesday, September 06, 2024. Report to [facility] Main Entrance A at 7:15 A.M., then check in with the Admitting office. Any questions or running late day of surgery: call [phone]   Questions prior to your surgery date: call [phone], Monday-Friday, 8am-4pm. If you experience any cold or flu symptoms such as cough, fever, chills, shortness of breath, etc. between now and your scheduled surgery, please notify us  at the above number.            Remember:       Do not eat after midnight the night before your surgery     You may drink clear liquids until 6:15am the morning of your surgery.   Clear liquids allowed are: Water, Non-Citrus Juices (without pulp), Carbonated Beverages, Clear Tea (no milk, honey, etc.), Black Coffee Only (NO MILK, CREAM OR POWDERED CREAMER of any kind), and Gatorade.   Patient Instructions   The night before surgery:  No food after midnight. ONLY clear liquids after midnight   The day of surgery (if you do NOT have diabetes):  Drink ONE (1) Pre-Surgery Clear Ensure by 9:15am the morning of surgery. Drink in one sitting. Do not sip.  This drink was given to you during your hospital  pre-op appointment visit.   Nothing else to drink after completing the  Pre-Surgery Clear Ensure.            If you have questions, please contact your surgeon's office.            Take these medicines the morning of surgery with A SIP OF WATER : Fluticasone (Flonase) nasal spray Levothyroxine (Synthroid) Pantoprazole (Protonix) Timolol (Timoptic) eye drops     May take these medicines IF NEEDED: Acetaminophen (Tylenol)     One week prior to surgery, STOP taking any Aspirin (unless otherwise instructed by your surgeon) Aleve, Naproxen, Ibuprofen, Motrin, Advil, Goody's, BC's, all herbal medications, fish oil, and non-prescription vitamins.                     Do NOT Smoke (Tobacco/Vaping) for 24 hours  prior to your procedure.   If you use a CPAP at night, you may bring your mask/headgear for your overnight stay.   You will be asked to remove any contacts, glasses, piercing's, hearing aid's, dentures/partials prior to surgery. Please bring cases for these items if needed.    Patients discharged the day of surgery will not be allowed to drive home, and someone needs to stay with them for 24 hours.   SURGICAL WAITING ROOM VISITATION Patients may have no more than 2 support people in the waiting area - these visitors may rotate.   Pre-op nurse will coordinate an appropriate time for 1 ADULT support person, who may not rotate, to accompany patient in pre-op.  Children under the age of 57 must have an adult with them who is not the patient and must remain in the main waiting area with an adult.   If the patient needs to stay at the hospital during part of their recovery, the visitor guidelines for inpatient rooms apply.   Please refer to the [facility] website for the visitor guidelines for any additional information.     If you received a COVID test during your pre-op visit  it is requested that you wear a mask when out in public, stay away from anyone that may not be feeling well and notify your  surgeon if you develop symptoms. If you have been in contact with anyone that has tested positive in the last 10 days please notify you surgeon.         Pre-operative CHG Bathing Instructions    You can play a key role in reducing the risk of infection after surgery. Your skin needs to be as free of germs as possible. You can reduce the number of germs on your skin by washing with CHG (chlorhexidine gluconate) soap before surgery. CHG is an antiseptic soap that kills germs and continues to kill germs even after washing.    DO NOT use if you have an allergy to chlorhexidine/CHG or antibacterial soaps. If your skin becomes reddened or irritated, stop using the CHG and notify one of our RNs at  [phone].               TAKE A SHOWER THE NIGHT BEFORE SURGERY AND THE DAY OF SURGERY     Please keep in mind the following:  DO NOT shave, including legs and underarms, 48 hours prior to surgery.   You may shave your face before/day of surgery.  Place clean sheets on your bed the night before surgery Use a clean washcloth (not used since being washed) for each shower. DO NOT sleep with pet's night before surgery.   CHG Shower Instructions:  Wash your face and private area with normal soap. If you choose to wash your hair, wash first with your normal shampoo.  After you use shampoo/soap, rinse your hair and body thoroughly to remove shampoo/soap residue.  Turn the water OFF and apply half the bottle of CHG soap to a CLEAN washcloth.  Apply CHG soap ONLY FROM YOUR NECK DOWN TO YOUR TOES (washing for 3-5 minutes)  DO NOT use CHG soap on face, private areas, open wounds, or sores.  Pay special attention to the area where your surgery is being performed.  If you are having back surgery, having someone wash your back for you may be helpful. Wait 2 minutes after CHG soap is applied, then you may rinse off the CHG soap.  Pat dry with a clean towel  Put on clean pajamas     Additional instructions for the day of surgery: DO NOT APPLY any lotions, deodorants, cologne, or perfumes.   Do not wear jewelry or makeup Do not wear nail polish, gel polish, artificial nails, or any other type of covering on natural nails (fingers and toes) Do not bring valuables to the hospital. [facility] is not responsible for valuables/personal belongings. Put on clean/comfortable clothes.  Please brush your teeth.  Ask your nurse before applying any prescription medications to the skin.

## 2024-09-05 NOTE — Anesthesia Preprocedure Evaluation (Signed)
 Anesthesia Evaluation  Patient identified by MRN, date of birth, ID band Patient awake    Reviewed: Allergy & Precautions, NPO status , Patient's Chart, lab work & pertinent test results  History of Anesthesia Complications (+) PONV  Airway Mallampati: II  TM Distance: >3 FB Neck ROM: Full    Dental  (+) Dental Advisory Given   Pulmonary neg pulmonary ROS   breath sounds clear to auscultation       Cardiovascular (-) angina  Rhythm:Regular Rate:Normal  '22 ECHO: EF 55-60%, normal LVF, mild LVH, Grade 1 DD, normal RVF, mild TR   Neuro/Psych negative neurological ROS     GI/Hepatic Neg liver ROS, hiatal hernia,GERD  Medicated and Controlled,,Symptomatic cholelithiasis   Endo/Other  Hypothyroidism  BMI 35  Renal/GU negative Renal ROS     Musculoskeletal   Abdominal   Peds  Hematology Hb 14.1, plt 172k   Anesthesia Other Findings   Reproductive/Obstetrics                              Anesthesia Physical Anesthesia Plan  ASA: 3  Anesthesia Plan: General   Post-op Pain Management: Tylenol  PO (pre-op)*   Induction:   PONV Risk Score and Plan: 4 or greater and Ondansetron , Dexamethasone , Propofol  infusion and Treatment may vary due to age or medical condition  Airway Management Planned: Oral ETT  Additional Equipment: None  Intra-op Plan:   Post-operative Plan: Extubation in OR  Informed Consent: I have reviewed the patients History and Physical, chart, labs and discussed the procedure including the risks, benefits and alternatives for the proposed anesthesia with the patient or authorized representative who has indicated his/her understanding and acceptance.     Dental advisory given  Plan Discussed with: CRNA and Surgeon  Anesthesia Plan Comments:          Anesthesia Quick Evaluation

## 2024-09-06 ENCOUNTER — Encounter (HOSPITAL_COMMUNITY): Payer: Self-pay | Admitting: General Surgery

## 2024-09-06 ENCOUNTER — Encounter (HOSPITAL_COMMUNITY)
Admission: RE | Disposition: A | Discharge status: Home / Self Care | Payer: Self-pay | Source: Home / Self Care | Attending: General Surgery

## 2024-09-06 ENCOUNTER — Ambulatory Visit (HOSPITAL_COMMUNITY)
Admission: RE | Admit: 2024-09-06 | Discharge: 2024-09-06 | Disposition: A | Discharge status: Home / Self Care | Attending: General Surgery | Admitting: General Surgery

## 2024-09-06 ENCOUNTER — Ambulatory Visit (HOSPITAL_COMMUNITY): Admitting: Anesthesiology

## 2024-09-06 ENCOUNTER — Other Ambulatory Visit: Payer: Self-pay

## 2024-09-06 DIAGNOSIS — K449 Diaphragmatic hernia without obstruction or gangrene: Secondary | ICD-10-CM | POA: Insufficient documentation

## 2024-09-06 DIAGNOSIS — E0500 Thyrotoxicosis with diffuse goiter without thyrotoxic crisis or storm: Secondary | ICD-10-CM | POA: Diagnosis not present

## 2024-09-06 DIAGNOSIS — H409 Unspecified glaucoma: Secondary | ICD-10-CM | POA: Insufficient documentation

## 2024-09-06 DIAGNOSIS — Z79899 Other long term (current) drug therapy: Secondary | ICD-10-CM | POA: Insufficient documentation

## 2024-09-06 DIAGNOSIS — E039 Hypothyroidism, unspecified: Secondary | ICD-10-CM | POA: Diagnosis not present

## 2024-09-06 DIAGNOSIS — K8020 Calculus of gallbladder without cholecystitis without obstruction: Secondary | ICD-10-CM | POA: Diagnosis not present

## 2024-09-06 DIAGNOSIS — K8010 Calculus of gallbladder with chronic cholecystitis without obstruction: Secondary | ICD-10-CM | POA: Insufficient documentation

## 2024-09-06 DIAGNOSIS — K219 Gastro-esophageal reflux disease without esophagitis: Secondary | ICD-10-CM | POA: Insufficient documentation

## 2024-09-06 HISTORY — PX: CHOLECYSTECTOMY: SHX55

## 2024-09-06 SURGERY — LAPAROSCOPIC CHOLECYSTECTOMY
Anesthesia: General

## 2024-09-06 MED ORDER — FENTANYL CITRATE (PF) 100 MCG/2ML IJ SOLN
INTRAMUSCULAR | Status: DC | PRN
  Administered 2024-09-06 (×2): 50 ug via INTRAVENOUS

## 2024-09-06 MED ORDER — 0.9 % SODIUM CHLORIDE (POUR BTL) OPTIME
TOPICAL | Status: DC | PRN
  Administered 2024-09-06: 1000 mL

## 2024-09-06 MED ORDER — BUPIVACAINE HCL (PF) 0.25 % IJ SOLN
INTRAMUSCULAR | Status: AC
  Filled 2024-09-06: qty 30

## 2024-09-06 MED ORDER — ONDANSETRON HCL 4 MG/2ML IJ SOLN
INTRAMUSCULAR | Status: AC
  Filled 2024-09-06: qty 2

## 2024-09-06 MED ORDER — HYDROMORPHONE HCL 1 MG/ML IJ SOLN
0.2500 mg | INTRAMUSCULAR | Status: DC | PRN
  Administered 2024-09-06 (×2): 0.5 mg via INTRAVENOUS

## 2024-09-06 MED ORDER — SODIUM CHLORIDE 0.9 % IR SOLN
Status: DC | PRN
  Administered 2024-09-06: 1000 mL

## 2024-09-06 MED ORDER — OXYCODONE HCL 5 MG PO TABS: 5.0000 mg | ORAL_TABLET | Freq: Once | ORAL | Status: DC | PRN

## 2024-09-06 MED ORDER — ACETAMINOPHEN 500 MG PO TABS
1000.0000 mg | ORAL_TABLET | Freq: Once | ORAL | Status: AC
  Administered 2024-09-06: 1000 mg via ORAL
  Filled 2024-09-06: qty 2

## 2024-09-06 MED ORDER — DEXAMETHASONE SODIUM PHOSPHATE 10 MG/ML IJ SOLN
INTRAMUSCULAR | Status: AC
  Filled 2024-09-06: qty 1

## 2024-09-06 MED ORDER — HYDROMORPHONE HCL 1 MG/ML IJ SOLN
INTRAMUSCULAR | Status: AC
  Filled 2024-09-06: qty 1

## 2024-09-06 MED ORDER — LACTATED RINGERS IV SOLN: INTRAVENOUS | Status: DC

## 2024-09-06 MED ORDER — MEPERIDINE HCL 25 MG/ML IJ SOLN
6.2500 mg | INTRAMUSCULAR | Status: DC | PRN
  Filled 2024-09-06: qty 1

## 2024-09-06 MED ORDER — LIDOCAINE HCL (CARDIAC) PF 100 MG/5ML IV SOSY
PREFILLED_SYRINGE | INTRAVENOUS | Status: DC | PRN
  Administered 2024-09-06: 30 mg via INTRAVENOUS

## 2024-09-06 MED ORDER — PROPOFOL 10 MG/ML IV BOLUS
INTRAVENOUS | Status: AC
  Filled 2024-09-06: qty 20

## 2024-09-06 MED ORDER — DIPHENHYDRAMINE HCL 50 MG/ML IJ SOLN
INTRAMUSCULAR | Status: AC
  Filled 2024-09-06: qty 1

## 2024-09-06 MED ORDER — ENSURE PRE-SURGERY PO LIQD: 296.0000 mL | Freq: Once | ORAL | Status: DC

## 2024-09-06 MED ORDER — SUGAMMADEX SODIUM 200 MG/2ML IV SOLN
INTRAVENOUS | Status: DC | PRN
Start: 2024-09-06 — End: 2024-09-06
  Administered 2024-09-06: 200 mg via INTRAVENOUS

## 2024-09-06 MED ORDER — PROPOFOL 500 MG/50ML IV EMUL
INTRAVENOUS | Status: DC | PRN
  Administered 2024-09-06: 125 ug/kg/min via INTRAVENOUS

## 2024-09-06 MED ORDER — LIDOCAINE 2% (20 MG/ML) 5 ML SYRINGE
INTRAMUSCULAR | Status: AC
  Filled 2024-09-06: qty 5

## 2024-09-06 MED ORDER — VANCOMYCIN HCL 1500 MG/300ML IV SOLN
1500.0000 mg | INTRAVENOUS | Status: AC
Start: 2024-09-06 — End: 2024-09-06
  Administered 2024-09-06: 1500 mg via INTRAVENOUS
  Filled 2024-09-06: qty 300

## 2024-09-06 MED ORDER — ORAL CARE MOUTH RINSE: 15.0000 mL | Freq: Once | OROMUCOSAL | Status: AC

## 2024-09-06 MED ORDER — ONDANSETRON HCL 4 MG/2ML IJ SOLN
INTRAMUSCULAR | Status: DC | PRN
  Administered 2024-09-06: 4 mg via INTRAVENOUS

## 2024-09-06 MED ORDER — ROCURONIUM BROMIDE 10 MG/ML (PF) SYRINGE
PREFILLED_SYRINGE | INTRAVENOUS | Status: AC
  Filled 2024-09-06: qty 10

## 2024-09-06 MED ORDER — ACETAMINOPHEN 500 MG PO TABS: 1000.0000 mg | ORAL_TABLET | ORAL | Status: DC

## 2024-09-06 MED ORDER — GLYCOPYRROLATE 0.2 MG/ML IJ SOLN
INTRAMUSCULAR | Status: DC | PRN
  Administered 2024-09-06: .2 mg via INTRAVENOUS

## 2024-09-06 MED ORDER — EPHEDRINE SULFATE-NACL 50-0.9 MG/10ML-% IV SOSY
PREFILLED_SYRINGE | INTRAVENOUS | Status: DC | PRN
Start: 2024-09-06 — End: 2024-09-06
  Administered 2024-09-06: 10 mg via INTRAVENOUS

## 2024-09-06 MED ORDER — TRAMADOL HCL 50 MG PO TABS: 50.0000 mg | ORAL_TABLET | Freq: Four times a day (QID) | ORAL | 0 refills | Status: DC | PRN

## 2024-09-06 MED ORDER — DEXAMETHASONE SODIUM PHOSPHATE 10 MG/ML IJ SOLN
INTRAMUSCULAR | Status: DC | PRN
Start: 2024-09-06 — End: 2024-09-06
  Administered 2024-09-06: 5 mg via INTRAVENOUS

## 2024-09-06 MED ORDER — OXYCODONE HCL 5 MG/5ML PO SOLN: 5.0000 mg | Freq: Once | ORAL | Status: DC | PRN

## 2024-09-06 MED ORDER — PROPOFOL 10 MG/ML IV BOLUS
INTRAVENOUS | Status: DC | PRN
  Administered 2024-09-06: 110 mg via INTRAVENOUS
  Administered 2024-09-06: 90 mg via INTRAVENOUS

## 2024-09-06 MED ORDER — CHLORHEXIDINE GLUCONATE CLOTH 2 % EX PADS: 6.0000 | MEDICATED_PAD | Freq: Once | CUTANEOUS | Status: DC

## 2024-09-06 MED ORDER — FENTANYL CITRATE (PF) 250 MCG/5ML IJ SOLN
INTRAMUSCULAR | Status: AC
  Filled 2024-09-06: qty 5

## 2024-09-06 MED ORDER — ROCURONIUM BROMIDE 100 MG/10ML IV SOLN
INTRAVENOUS | Status: DC | PRN
Start: 2024-09-06 — End: 2024-09-06
  Administered 2024-09-06: 60 mg via INTRAVENOUS

## 2024-09-06 MED ORDER — BUPIVACAINE HCL 0.25 % IJ SOLN
INTRAMUSCULAR | Status: DC | PRN
  Administered 2024-09-06: 10 mL

## 2024-09-06 MED ORDER — CHLORHEXIDINE GLUCONATE 0.12 % MT SOLN
15.0000 mL | Freq: Once | OROMUCOSAL | Status: AC
  Administered 2024-09-06: 15 mL via OROMUCOSAL
  Filled 2024-09-06: qty 15

## 2024-09-06 SURGICAL SUPPLY — 37 items
BAG COUNTER SPONGE SURGICOUNT (BAG) ×2 IMPLANT
CANISTER SUCTION 3000ML PPV (SUCTIONS) ×2 IMPLANT
CHLORAPREP W/TINT 26 (MISCELLANEOUS) ×2 IMPLANT
CLIP LIGATING HEMO O LOK GREEN (MISCELLANEOUS) ×2 IMPLANT
CNTNR URN SCR LID CUP LEK RST (MISCELLANEOUS) IMPLANT
COVER SURGICAL LIGHT HANDLE (MISCELLANEOUS) ×2 IMPLANT
COVER TRANSDUCER ULTRASND (DRAPES) ×2 IMPLANT
DERMABOND ADVANCED .7 DNX12 (GAUZE/BANDAGES/DRESSINGS) ×2 IMPLANT
DEVICE TROCAR PUNCTURE CLOSURE (ENDOMECHANICALS) IMPLANT
DRAPE LAPAROSCOPIC ABDOMINAL (DRAPES) IMPLANT
ELECTRODE REM PT RTRN 9FT ADLT (ELECTROSURGICAL) ×2 IMPLANT
GLOVE BIO SURGEON STRL SZ7.5 (GLOVE) ×4 IMPLANT
GOWN STRL REUS W/ TWL LRG LVL3 (GOWN DISPOSABLE) ×4 IMPLANT
GOWN STRL REUS W/ TWL XL LVL3 (GOWN DISPOSABLE) ×2 IMPLANT
GRASPER SUT TROCAR 14GX15 (MISCELLANEOUS) ×2 IMPLANT
IRRIGATION SUCT STRKRFLW 2 WTP (MISCELLANEOUS) ×2 IMPLANT
KIT BASIN OR (CUSTOM PROCEDURE TRAY) ×2 IMPLANT
KIT IMAGING PINPOINTPAQ (MISCELLANEOUS) IMPLANT
KIT TURNOVER KIT B (KITS) ×2 IMPLANT
NDL INSUFFLATION 14GA 120MM (NEEDLE) ×2 IMPLANT
NEEDLE INSUFFLATION 14GA 120MM (NEEDLE) ×1 IMPLANT
NS IRRIG 1000ML POUR BTL (IV SOLUTION) ×2 IMPLANT
PAD ARMBOARD POSITIONER FOAM (MISCELLANEOUS) ×2 IMPLANT
POUCH LAPAROSCOPIC INSTRUMENT (MISCELLANEOUS) ×2 IMPLANT
POUCH RETRIEVAL ECOSAC 10 (ENDOMECHANICALS) IMPLANT
SCISSORS LAP 5X35 DISP (ENDOMECHANICALS) ×2 IMPLANT
SET TUBE SMOKE EVAC HIGH FLOW (TUBING) ×2 IMPLANT
SLEEVE Z-THREAD 5X100MM (TROCAR) ×2 IMPLANT
SUT MNCRL AB 4-0 PS2 18 (SUTURE) ×2 IMPLANT
SUT VIC AB 0 CT1 27XBRD ANBCTR (SUTURE) IMPLANT
TOWEL GREEN STERILE (TOWEL DISPOSABLE) ×2 IMPLANT
TOWEL GREEN STERILE FF (TOWEL DISPOSABLE) ×2 IMPLANT
TRAY LAPAROSCOPIC MC (CUSTOM PROCEDURE TRAY) ×2 IMPLANT
TROCAR 11X100 Z THREAD (TROCAR) ×2 IMPLANT
TROCAR Z-THREAD OPTICAL 5X100M (TROCAR) ×2 IMPLANT
WARMER LAPAROSCOPE (MISCELLANEOUS) ×2 IMPLANT
WATER STERILE IRR 1000ML POUR (IV SOLUTION) ×2 IMPLANT

## 2024-09-06 NOTE — Anesthesia Postprocedure Evaluation (Signed)
 Anesthesia Post Note  Patient: Shannon Figueroa  Procedure(s) Performed: LAPAROSCOPIC CHOLECYSTECTOMY     Patient location during evaluation: PACU Anesthesia Type: General Level of consciousness: awake and alert, oriented and patient cooperative Pain control: pain improving. Vital Signs Assessment: post-procedure vital signs reviewed and stable Respiratory status: spontaneous breathing, nonlabored ventilation and respiratory function stable Cardiovascular status: blood pressure returned to baseline and stable Postop Assessment: no apparent nausea or vomiting Anesthetic complications: no   No notable events documented.  Last Vitals:  Vitals:   09/06/24 1301 09/06/24 1315  BP:  119/63  Pulse: (!) 47 (!) 46  Resp: 17 19  Temp:    SpO2: 94% 93%    Last Pain:  Vitals:   09/06/24 1245  PainSc: 9                  Adabella Stanis,E. Yara Tomkinson

## 2024-09-06 NOTE — Interval H&P Note (Signed)
 History and Physical Interval Note:  09/06/2024 9:27 AM  Shannon Figueroa  has presented today for surgery, with the diagnosis of SYMPTOMATIC CHOLELITHIASIS.  The various methods of treatment have been discussed with the patient and family. After consideration of risks, benefits and other options for treatment, the patient has consented to  Procedure(s): LAPAROSCOPIC CHOLECYSTECTOMY (N/A) as a surgical intervention.  The patient's history has been reviewed, patient examined, no change in status, stable for surgery.  I have reviewed the patient's chart and labs.  Questions were answered to the patient's satisfaction.     Morningstar Toft

## 2024-09-06 NOTE — Op Note (Signed)
 09/06/2024  10:32 AM  PATIENT:  Rock JINNY Hurst  73 y.o. female  PRE-OPERATIVE DIAGNOSIS:  SYMPTOMATIC CHOLELITHIASIS  POST-OPERATIVE DIAGNOSIS:  SYMPTOMATIC CHOLELITHIASIS  PROCEDURE:  Procedure(s): LAPAROSCOPIC CHOLECYSTECTOMY (N/A)  SURGEON:  Surgeons and Role:    DEWAINE Rubin Calamity, MD - Primary  ASSISTANTS: none   ANESTHESIA:   local and general  EBL:  minimal   BLOOD ADMINISTERED:none  DRAINS: none   LOCAL MEDICATIONS USED:  BUPIVICAINE   SPECIMEN:  Source of Specimen:  gallbladder  DISPOSITION OF SPECIMEN:  PATHOLOGY  COUNTS:  YES  TOURNIQUET:  * No tourniquets in log *  DICTATION: .Dragon Dictation  EBL: <5cc   Complications: none   Counts: reported as correct x 2   Findings:chronically inflamed gallbladder  Indications for procedure: Pt is a 67F with RUQ pain and seen to have gallstones.   Details of the procedure: The patient was taken to the operating and placed in the supine position with bilateral SCDs in place. A time out was called and all facts were verified. A pneumoperitoneum was obtained via A Veress needle technique to a pressure of 14mm of mercury. A 5mm trochar was then placed in the right upper quadrant under visualization, and there were no injuries to any abdominal organs. A 11 mm port was then placed in the umbilical region after infiltrating with local anesthesia under direct visualization. A second epigastric port was placed under direct visualization.   The gallbladder was identified and retracted, the peritoneum was then sharply dissected from the gallbladder and this dissection was carried down to Calot's triangle. The cystic duct was identified and dissected circumferentially and seen going into the gallbladder 360.  The cystic artery was dissected away from the surrounding tissues.   The critical angle was obtained.   2 clips were placed proximally one distally and the cystic duct transected. The cystic artery was identified and 2 clips  placed proximally and one distally and transected. We then proceeded to remove the gallbladder off the hepatic fossa with Bovie cautery. A retrieval bag was then placed in the abdomen and gallbladder placed in the bag. The hepatic fossa was then reexamined and hemostasis was achieved with Bovie cautery and was excellent at this portion of the case. The subhepatic fossa and perihepatic fossa was then irrigated until the effluent was clear. The specimen bag and specimen were removed from the abdominal cavity.  The 11 mm trocar fascia was reapproximated with the Endo Close #1 Vicryl x2. The pneumoperitoneum was evacuated and all trochars removed under direct visulalization. The skin was then closed with 4-0 Monocryl and the skin dressed with Dermabond. The patient was awaken from general anesthesia and taken to the recovery room in stable condition.    PLAN OF CARE: Discharge to home after PACU  PATIENT DISPOSITION:  PACU - hemodynamically stable.   Delay start of Pharmacological VTE agent (>24hrs) due to surgical blood loss or risk of bleeding: not applicable

## 2024-09-06 NOTE — Transfer of Care (Signed)
 Immediate Anesthesia Transfer of Care Note  Patient: Shannon Figueroa  Procedure(s) Performed: LAPAROSCOPIC CHOLECYSTECTOMY  Patient Location: PACU  Anesthesia Type:General  Level of Consciousness: awake and alert   Airway & Oxygen Therapy: Patient Spontanous Breathing and Patient connected to face mask oxygen  Post-op Assessment: Report given to RN and Post -op Vital signs reviewed and stable  Post vital signs: Reviewed and stable  Last Vitals:  Vitals Value Taken Time  BP 127/65 09/06/24 10:48  Temp    Pulse 65 09/06/24 10:50  Resp 28 09/06/24 10:50  SpO2 98 % 09/06/24 10:50  Vitals shown include unfiled device data.  Last Pain:  Vitals:   09/06/24 0748  PainSc: 0-No pain         Complications: No notable events documented.

## 2024-09-06 NOTE — Anesthesia Procedure Notes (Addendum)
 Procedure Name: Intubation Date/Time: 09/06/2024 9:50 AM  Performed by: Vertie Russell NOVAK, CRNAPre-anesthesia Checklist: Patient identified, Emergency Drugs available, Suction available and Patient being monitored Patient Re-evaluated:Patient Re-evaluated prior to induction Oxygen Delivery Method: Circle System Utilized Preoxygenation: Pre-oxygenation with 100% oxygen Induction Type: IV induction Ventilation: Oral airway inserted - appropriate to patient size Laryngoscope Size: Mac and 3 Grade View: Grade II Tube type: Oral Tube size: 7.0 mm Number of attempts: 2 Airway Equipment and Method: Stylet and Oral airway Placement Confirmation: ETT inserted through vocal cords under direct vision, positive ETCO2 and breath sounds checked- equal and bilateral Secured at: 21 cm Tube secured with: Tape Dental Injury: Injury to lip  Comments: First attempt made by SRNA, unable to pass ETT through vocal cords, CRNA made second attempt, and was successful. Patient  left upper lip abrasion with SRNA attempt, ointment applied.

## 2024-09-06 NOTE — Discharge Instructions (Signed)
 [facility] ______[facility] LAPAROSCOPIC SURGERY: POST OP INSTRUCTIONS Always review your discharge instruction sheet given to you by the facility where your surgery was performed. IF YOU HAVE DISABILITY OR FAMILY LEAVE FORMS, YOU MUST BRING THEM TO THE OFFICE FOR PROCESSING.   DO NOT GIVE THEM TO YOUR DOCTOR.  A prescription for pain medication may be given to you upon discharge.  Take your pain medication as prescribed, if needed.  If narcotic pain medicine is not needed, then you may take acetaminophen  (Tylenol ) or ibuprofen  (Advil ) as needed. Take your usually prescribed medications unless otherwise directed. If you need a refill on your pain medication, please contact your pharmacy.  They will contact our office to request authorization. Prescriptions will not be filled after 5pm or on week-ends. You should follow a light diet the first few days after arrival home, such as soup and crackers, etc.  Be sure to include lots of fluids daily. Most patients will experience some swelling and bruising in the area of the incisions.  Ice packs will help.  Swelling and bruising can take several days to resolve.  It is common to experience some constipation if taking pain medication after surgery.  Increasing fluid intake and taking a stool softener (such as Colace) will usually help or prevent this problem from occurring.  A mild laxative (Milk of Magnesia or Miralax) should be taken according to package instructions if there are no bowel movements after 48 hours. Unless discharge instructions indicate otherwise, you may remove your bandages 24-48 hours after surgery, and you may shower at that time.  You may have steri-strips (small skin tapes) in place directly over the incision.  These strips should be left on the skin for 7-10 days.  If your surgeon used skin glue on the incision, you may shower in 24 hours.  The glue will flake off over the next 2-3 weeks.  Any sutures or staples will be  removed at the office during your follow-up visit. ACTIVITIES:  You may resume regular (light) daily activities beginning the next day--such as daily self-care, walking, climbing stairs--gradually increasing activities as tolerated.  You may have sexual intercourse when it is comfortable.  Refrain from any heavy lifting or straining until approved by your doctor. You may drive when you are no longer taking prescription pain medication, you can comfortably wear a seatbelt, and you can safely maneuver your car and apply brakes. RETURN TO WORK:  __________________________________________________________ Shannon Figueroa should see your doctor in the office for a follow-up appointment approximately 2-3 weeks after your surgery.  Make sure that you call for this appointment within a day or two after you arrive home to insure a convenient appointment time. OTHER INSTRUCTIONS: __________________________________________________________________________________________________________________________ __________________________________________________________________________________________________________________________ WHEN TO CALL YOUR DOCTOR: Fever over 101.0 Inability to urinate Continued bleeding from incision. Increased pain, redness, or drainage from the incision. Increasing abdominal pain  The clinic staff is available to answer your questions during regular business hours.  Please don't hesitate to call and ask to speak to one of the nurses for clinical concerns.  If you have a medical emergency, go to the nearest emergency room or call 911.  A surgeon from [facility] is always on call at the hospital. [address], [address], [place], [place]  [address] ? [address] [address], [place], [place]   [address] [phone] ? [phone] ? FAX [phone] Web site: [email or web address]

## 2024-09-07 ENCOUNTER — Encounter (HOSPITAL_COMMUNITY): Payer: Self-pay | Admitting: General Surgery

## 2024-09-07 LAB — SURGICAL PATHOLOGY

## 2024-09-20 ENCOUNTER — Ambulatory Visit: Admitting: Physician Assistant

## 2024-09-27 ENCOUNTER — Telehealth: Payer: Self-pay

## 2024-09-27 NOTE — Telephone Encounter (Signed)
 Pharmacy Patient Advocate Encounter   Received notification from CoverMyMeds that prior authorization for Voquezna  10MG  tablets is required/requested.   Insurance verification completed.   The patient is insured through [facility].   Per test claim: xxx

## 2024-09-28 NOTE — Telephone Encounter (Signed)
 Called and spoke with patient. Patient state that Voquezna  worked about the same as her Protonix and it is more expensive.  Patient will continue Protonix at this time. Patient will call if she has any questions or concerns.

## 2024-09-28 NOTE — Telephone Encounter (Signed)
 Last script on file shown below. I had someone check behind me as well, and the last time patient filled medication was in July. Please verify with patient if she is taking still the fill was only for #30 tablets

## 2024-10-17 ENCOUNTER — Encounter: Payer: Self-pay | Admitting: Physician Assistant

## 2024-10-17 ENCOUNTER — Ambulatory Visit: Admitting: Physician Assistant

## 2024-10-17 VITALS — BP 130/80 | HR 70 | Ht 67.0 in | Wt 223.0 lb

## 2024-10-17 DIAGNOSIS — K449 Diaphragmatic hernia without obstruction or gangrene: Secondary | ICD-10-CM

## 2024-10-17 DIAGNOSIS — K219 Gastro-esophageal reflux disease without esophagitis: Secondary | ICD-10-CM

## 2024-10-17 DIAGNOSIS — D509 Iron deficiency anemia, unspecified: Secondary | ICD-10-CM

## 2024-10-17 DIAGNOSIS — Z860100 Personal history of colon polyps, unspecified: Secondary | ICD-10-CM

## 2024-10-17 DIAGNOSIS — K3184 Gastroparesis: Secondary | ICD-10-CM | POA: Diagnosis not present

## 2024-10-17 DIAGNOSIS — K8020 Calculus of gallbladder without cholecystitis without obstruction: Secondary | ICD-10-CM

## 2024-10-17 DIAGNOSIS — E669 Obesity, unspecified: Secondary | ICD-10-CM

## 2024-10-17 MED ORDER — METOCLOPRAMIDE HCL 5 MG PO TABS
5.0000 mg | ORAL_TABLET | Freq: Three times a day (TID) | ORAL | 1 refills | Status: AC | PRN
Start: 2024-10-17 — End: 2024-11-06

## 2024-10-17 MED ORDER — METRONIDAZOLE 250 MG PO TABS: 250.0000 mg | ORAL_TABLET | Freq: Three times a day (TID) | ORAL | 0 refills | Status: AC

## 2024-10-17 MED ORDER — FAMOTIDINE 40 MG PO TABS: 40.0000 mg | ORAL_TABLET | Freq: Every day | ORAL | 3 refills | Status: AC

## 2024-10-17 NOTE — Progress Notes (Signed)
 10/17/2024 Shannon Figueroa [id number] [birth year]  Referring provider: Toribio Jerel MATSU, MD Primary GI doctor: Dr. Federico (Dr. Aneita)  ASSESSMENT AND PLAN:  GERD/hiatal hernia/gastroparesis/cameron erosions 2022  With AB pain No NSAIDS, no ETOH 2011 GES delayed gastric emptying 07/20/2024 EGD Dr. Federico 7 cm [facility] localized inflammation characterized by congestion erythema nodularity gastric body nonbleeding diverticulum duodenal bulb normal esophagus.    Pathology negative H. pylori and metaplasia 07/26/2024 RUQ US  cholelithiasis fatty liver 08/17/2024 HIDA absence of gallbladder activity status post laparoscopic cholecystectomy 09/06/2024 with Dr. Rubin Voquezna  did not help, she is on protonix 40 mg BID, pepcid  40 mg at night Likely from hiatal hernia, gastroparesis, or SIBO - Prescribed Reglan (metoclopramide) as needed with informed consent regarding potential side effects, including tardive dyskinesia. - Provided dietary modifications for gastroparesis. - Continue pantoprazole 40 mg twice daily. - Prescribed metronidazole for two weeks. - Refill Pepcid  AC 40 mg at bedtime. - consider referral to general sugery  History of IDA thought secondary to [employer] with oncology last seen 01/13/2024 On oral iron and B12 with resolution 01/06/2024  HGB 14.3 MCV 91.1 Platelets 158 01/06/2024 Iron 68 Ferritin 69 B12 903 Recent Labs    01/06/24 1021 09/02/24 1126  HGB 14.3 14.1  02/26/2021 colonoscopy no source of bleeding recall 7 years 09/2021 EGD medium-size hiatal hernia Ole erosions likely source of chronic blood loss and IDA  B12 deficiency 01/06/2024  B12 903  Personal history of colon polyps 02/26/2021 colonoscopy adequate bowel prep 2 TA polyps 7 to 8 mm transverse, tics right colon tics left colon, recall 7 years  Obesity  Body mass index is 34.93 kg/m.  -Patient has been advised to make an attempt to improve diet and exercise patterns to aid in weight  loss. -Recommended diet heavy in fruits and veggies and low in animal meats, cheeses, and dairy products, appropriate calorie intake   Patient Care Team: Toribio Jerel MATSU, MD as PCP - General (Family Medicine)  HISTORY OF PRESENT ILLNESS: 73 y.o. female with a past medical history listed below presents for evaluation of GERD.   I last saw the patient in the office 07/18/2024 for abdominal pain nausea. 07/20/2024 EGD Dr. Federico 7 cm [facility] localized inflammation characterized by congestion erythema nodularity gastric body nonbleeding diverticulum duodenal bulb normal esophagus.  Pantoprazole twice daily reflux Gourmet consider Reglan.  Pathology negative H. pylori and metaplasia  Discussed the use of AI scribe software for clinical note transcription with the patient, who gave verbal consent to proceed.  History of Present Illness   Shannon Figueroa is a 73 year old female with a history of hiatal hernia and gastroparesis who presents with ongoing abdominal symptoms.  She experiences persistent abdominal symptoms including bloating, gas, and audible stomach rolling, typically occurring a couple of hours after eating. These symptoms have persisted despite previous treatments. An episode after consuming a small piece of pound cake with strawberries resulted in significant discomfort until 3 AM, characterized by bloating and gas but no vomiting.  Her medical history includes a large hiatal hernia and gastroparesis; she recalls undergoing a study in 2011 where she ate a radioactive egg and was told her stomach was moving slow. An endoscopy revealed a large hiatal hernia and gastritis but was negative for metaplasia, dysplasia, H. pylori, and esophageal abnormalities. She also had gallstones and a non-functioning gallbladder, which was removed on September 06, 2024.  Her current medications include pantoprazole 40 mg twice daily and Pepcid  AC  40 mg at bedtime. She previously tried Voquezna , which was ineffective  and costly, leading her to discontinue it after one month. She also uses antacids as needed for symptom relief.  Her symptoms have not significantly improved since the gallbladder removal. She experiences episodes of stomach churning and bloating, particularly after certain foods, and notes that her symptoms can be severe enough to disrupt her sleep. No vomiting is reported.        She  reports that she has never smoked. She has never used smokeless tobacco. She reports that she does not drink alcohol and does not use drugs.  RELEVANT GI HISTORY, IMAGING AND LABS: Results   DIAGNOSTIC Endoscopy: Large hiatal hernia, gastritis, normal esophagus (06/2024)  PATHOLOGY Negative for metaplasia, no cancer cells, negative for H. pylori (06/2024)      CBC    Component Value Date/Time   WBC 6.2 09/02/2024 1126   RBC 4.87 09/02/2024 1126   HGB 14.1 09/02/2024 1126   HCT 45.0 09/02/2024 1126   PLT 172 09/02/2024 1126   MCV 92.4 09/02/2024 1126   MCH 29.0 09/02/2024 1126   MCHC 31.3 09/02/2024 1126   RDW 14.0 09/02/2024 1126   LYMPHSABS 1.1 01/06/2024 1021   MONOABS 0.4 01/06/2024 1021   EOSABS 0.0 01/06/2024 1021   BASOSABS 0.0 01/06/2024 1021   Recent Labs    01/06/24 1021 09/02/24 1126  HGB 14.3 14.1    CMP     Component Value Date/Time   NA 138 01/06/2024 1021   K 4.4 01/06/2024 1021   CL 103 01/06/2024 1021   CO2 29 01/06/2024 1021   GLUCOSE 111 (H) 01/06/2024 1021   BUN 15 01/06/2024 1021   CREATININE 0.92 01/06/2024 1021   CALCIUM 9.1 01/06/2024 1021   PROT 6.8 01/06/2024 1021   ALBUMIN 4.0 01/06/2024 1021   AST 16 01/06/2024 1021   ALT 14 01/06/2024 1021   ALKPHOS 66 01/06/2024 1021   BILITOT 0.9 01/06/2024 1021   GFRNONAA >60 01/06/2024 1021      Latest Ref Rng & Units 01/06/2024   10:21 AM 02/05/2017    9:38 AM  Hepatic Function  Total Protein 6.5 - 8.1 g/dL 6.8  6.8   Albumin 3.5 - 5.0 g/dL 4.0  4.2   AST 15 - 41 U/L 16  14   ALT 0 - 44 U/L 14  12   Alk  Phosphatase 38 - 126 U/L 66  79   Total Bilirubin 0.0 - 1.2 mg/dL 0.9  0.7       Current Medications:   Current Outpatient Medications (Endocrine & Metabolic):    levothyroxine (SYNTHROID) 88 MCG tablet, Take 88 mcg by mouth in the morning.   Current Outpatient Medications (Respiratory):    fluticasone (FLONASE) 50 MCG/ACT nasal spray, Place 1 spray into both nostrils daily.  Current Outpatient Medications (Analgesics):    acetaminophen  (TYLENOL ) 500 MG tablet, Take 500 mg by mouth every 6 (six) hours as needed for moderate pain (pain score 4-6).   traMADol  (ULTRAM ) 50 MG tablet, Take 1 tablet (50 mg total) by mouth every 6 (six) hours as needed. (Patient not taking: Reported on 10/17/2024)  Current Outpatient Medications (Hematological):    Ferrous Sulfate (IRON) 325 (65 Fe) MG TABS, Take 325 mg by mouth daily.   vitamin B-12 (CYANOCOBALAMIN ) 1000 MCG tablet, Take 1,000 mcg by mouth daily.  Current Outpatient Medications (Other):    Alum Hydroxide-Mag Carbonate (GAVISCON PO), Take 1-2 tablets by mouth daily as needed (heartburn).  calcium carbonate (TUMS EX) 750 MG chewable tablet, Chew 1-2 tablets by mouth daily as needed (heartburn/indigestion).   cholecalciferol (VITAMIN D3) 25 MCG (1000 UNIT) tablet, Take 1,000 Units by mouth daily.   gabapentin (NEURONTIN) 300 MG capsule, Take 300-600 mg by mouth at bedtime.   metoCLOPramide (REGLAN) 5 MG tablet, Take 1 tablet (5 mg total) by mouth every 8 (eight) hours as needed for up to 20 days for nausea (AB pain).   metroNIDAZOLE (FLAGYL) 250 MG tablet, Take 1 tablet (250 mg total) by mouth 3 (three) times daily for 10 days.   pantoprazole (PROTONIX) 40 MG tablet, Take 40 mg by mouth 2 (two) times daily.   timolol (TIMOPTIC) 0.5 % ophthalmic solution, Place 1 drop into both eyes in the morning.   zolpidem (AMBIEN) 10 MG tablet, Take 10 mg by mouth at bedtime as needed for sleep.   famotidine  (PEPCID ) 40 MG tablet, Take 1 tablet (40 mg  total) by mouth at bedtime.   sucralfate  (CARAFATE ) 1 g tablet, Take 1 tablet (1 g total) by mouth 3 (three) times daily as needed (AB pain). (Patient not taking: Reported on 10/17/2024)  Medical History:  Past Medical History:  Diagnosis Date   Allergy    Anemia    Blood transfusion without reported diagnosis    for IDA   Complication of anesthesia    GERD (gastroesophageal reflux disease)    Glaucoma    Graves disease    Hiatal hernia    History of colon polyps    IDA (iron deficiency anemia)    PONV (postoperative nausea and vomiting)    Thyroid disease    Allergies:  Allergies  Allergen Reactions   Clindamycin/Lincomycin Other (See Comments)   Codeine Nausea And Vomiting     GI Upset   Dexlansoprazole Diarrhea   Esomeprazole Magnesium Diarrhea    nexium    Levofloxacin Other (See Comments)   Penicillins Rash   Rabeprazole Sodium Other (See Comments)    REACTION: Headaches   Valium  [Diazepam ] Other (See Comments)     Surgical History:  She  has a past surgical history that includes Thyroidectomy; Abdominal hysterectomy; Back surgery; Polypectomy; ovaries removed (2010); Breast excisional biopsy (Right); Colonoscopy (2016); and Cholecystectomy (N/A, 09/06/2024). Family History:  Her family history includes Cancer (age of onset: 41) in her brother; Colon cancer in her maternal grandmother; Rectal cancer (age of onset: 30) in her brother.  REVIEW OF SYSTEMS  : All other systems reviewed and negative except where noted in the History of Present Illness.  PHYSICAL EXAM: BP 130/80   Pulse 70   Ht 5' 7 (1.702 m)   Wt 223 lb (101.2 kg)   BMI 34.93 kg/m  Physical Exam   GENERAL APPEARANCE: Well nourished, in no apparent distress. HEENT: No cervical lymphadenopathy, unremarkable thyroid, sclerae anicteric, conjunctiva pink. RESPIRATORY: Respiratory effort normal, breath sounds equal bilaterally without rales, rhonchi, or wheezing. CARDIO: Regular rate and rhythm with no  murmurs, rubs, or gallops, peripheral pulses intact. ABDOMEN: Soft, non-distended, active bowel sounds in all four quadrants, no tenderness to palpation, no rebound, no mass appreciated. RECTAL: Declines. MUSCULOSKELETAL: Full range of motion, normal gait, without edema. SKIN: Dry, intact without rashes or lesions. No jaundice. Infected hair follicle on left side, warm to touch. NEURO: Alert, oriented, no focal deficits. PSYCH: Cooperative, normal mood and affect.      Alan JONELLE Coombs, PA-C 3:25 PM

## 2024-10-17 NOTE — Patient Instructions (Addendum)
 _______________________________________________________  If your blood pressure at your visit was 140/90 or greater, please contact your primary care physician to follow up on this.  _______________________________________________________  If you are age 71 or older, your body mass index should be between 23-30. Your Body mass index is 34.93 kg/m. If this is out of the aforementioned range listed, please consider follow up with your Primary Care Provider.  If you are age 17 or younger, your body mass index should be between 19-25. Your Body mass index is 34.93 kg/m. If this is out of the aformentioned range listed, please consider follow up with your Primary Care Provider.   ________________________________________________________  The [facility] providers would like to encourage you to use MYCHART to communicate with providers for non-urgent requests or questions.  Due to long hold times on the telephone, sending your provider a message by [facility] may be a faster and more efficient way to get a response.  Please allow 48 business hours for a response.  Please remember that this is for non-urgent requests.  _______________________________________________________  [facility] is using a team-based approach to care.  Your team is made up of your doctor and two to three APPS. Our APPS (Nurse Practitioners and Physician Assistants) work with your physician to ensure care continuity for you. They are fully qualified to address your health concerns and develop a treatment plan. They communicate directly with your gastroenterologist to care for you. Seeing the Advanced Practice Practitioners on your physician's team can help you by facilitating care more promptly, often allowing for earlier appointments, access to diagnostic testing, procedures, and other specialty referrals.   We have sent the following medications to your pharmacy for you to pick up at your convenience: Famotidine ,  metoclopramide, Flagyl   VISIT SUMMARY:  During your visit, we discussed your ongoing abdominal symptoms related to gastroparesis, a large hiatal hernia, gastritis, suspected small intestinal bacterial overgrowth (SIBO), and gastroesophageal reflux disease (GERD). We also addressed a postoperative skin lesion on your left abdomen.  YOUR PLAN:  GASTROPARESIS: Chronic condition causing delayed stomach emptying, leading to bloating, gas, and discomfort. -Prescribed Reglan (metoclopramide) to be taken as needed. Be aware of potential side effects, including involuntary muscle movements. -Follow dietary modifications for gastroparesis to help manage symptoms.  LARGE HIATAL HERNIA: A large hiatal hernia causing bloating and discomfort. -Referred to general surgery for a consultation to discuss potential surgical options.  GASTRITIS: Inflammation of the stomach lining, not caused by H. pylori infection. -Continue taking pantoprazole 40 mg twice daily.  SMALL INTESTINAL BACTERIAL OVERGROWTH (SIBO): Suspected bacterial overgrowth in the small intestine causing bloating and gas. -Prescribed metronidazole for two weeks to treat the suspected bacterial overgrowth.  GASTROESOPHAGEAL REFLUX DISEASE (GERD): Chronic acid reflux causing heartburn and discomfort. -Continue taking pantoprazole 40 mg twice daily. -Refill Pepcid  AC 40 mg to be taken at bedtime.  POSTOPERATIVE LEFT ABDOMINAL SKIN LESION: Possible infected follicle or boil on the left abdomen, likely due to irritation from surgical tape. -Apply a warm heating pad to the lesion. -If the lesion worsens, follow up with your primary care doctor.  Gastroparesis Gastroparesis is a condition in which food takes longer than normal to empty from the stomach.  This condition is also known as delayed gastric emptying. It is usually a long-term (chronic) condition.  What are the signs or symptoms? Symptoms of this condition include: Feeling full  after eating very little or a loss of appetite. Nausea, vomiting, or heartburn. Bloating of your abdomen. Inconsistent blood sugar (  glucose) levels on blood tests. Unexplained weight loss. Acid from the stomach coming up into the esophagus (gastroesophageal reflux). Sudden tightening (spasm) of the stomach, which can be painful. Symptoms may come and go. Some people may not notice any symptoms.  What increases the risk? You are more likely to develop this condition if: You have certain disorders or diseases. These may include: An endocrine disorder. An eating disorder. Amyloidosis. Scleroderma. Parkinson's disease. Multiple sclerosis. Cancer or infection of the stomach or the vagus nerve. You have had surgery on your stomach or vagus nerve. You take certain medicines. You are female.  Things you can do: Please do small frequent meals like 4-6 meals a day.  Eat and drink liquids at separate times.  Avoid high fiber foods, cook your vegetables, avoid high fat food.  Suggest spreading protein throughout the day (greek yogurt, glucerna, soft meat, milk, eggs) Choose soft foods that you can mash with a fork When you are more symptomatic, change to pureed foods foods and liquids.  Consider reading Living well with Gastroparesis by [facility] Check out this link to a diet online https://my.GroupJournal.fr   Reglan or metoclopramide  Can be used for gastroparesis or slow stomach, nausea, vomiting, GERD.   Continue the medication as needed up to 2 times a day, on an empty stomach 30 minutes before eating. It may take a few weeks for your stomach condition to start to get better. However, do not take this medicine for longer than 12 weeks.  The longer you take this medicine, and the more you take it, the greater your chances are of developing serious side effects.  Some people may get a  severe muscle problem called tardive dyskinesia. This problem may lessen or go away after stopping this drug, but it may not go away. The risk is greater with diabetes and in older adults, especially older females. The risk is greater with longer use or higher doses, but it may also occur after short-term use with low doses. Call your doctor right away if you have trouble controlling body movements or problems with your tongue, face, mouth, or jaw like tongue sticking out, puffing cheeks, mouth puckering, or chewing.   Please monitor for worsening depression or thoughts of suicide, any aggressiveness or hyperactivity.  If this happen please stop and call your physician right away.  Small intestinal bacterial overgrowth (SIBO) occurs when there is an abnormal increase in the overall bacterial population in the small intestine -- particularly types of bacteria not commonly found in that part of the digestive tract. Small intestinal bacterial overgrowth (SIBO) commonly results when a circumstance -- such as surgery or disease -- slows the passage of food and waste products in the digestive tract, creating a breeding ground for bacteria.  Signs and symptoms of SIBO often include: Loss of appetite Abdominal pain Nausea Bloating An uncomfortable feeling of fullness after eating Diarrhea or constipation, depending on the type of gas produced  What foods trigger SIBO? While foods aren't the original cause of SIBO, certain foods do encourage the overgrowth of the wrong bacteria in your small intestine. If you're feeding them their favorite foods, they're going to grow more, and that will trigger more of your SIBO symptoms. By the same token, you can help reduce the overgrowth by starving the problematic bacteria of their favorite foods. This strategy has led to a number of proposed SIBO eating plans. The plans vary, and so do individual results. But in general, they tend to recommend  limiting carbohydrates.   These include: Sugars and sweeteners. Fruits and starchy vegetables. Dairy products. Grains.  There is a test for this we can do called a breath test, if you are positive we will treat you with an antibiotic to see if it helps.  Your symptoms are very suspicious for this condition, as discussed, we will start you on an antibiotic to see if this helps.   Thank you for entrusting me with your care and choosing [facility]  Alan Coombs, PA-C

## 2024-10-28 ENCOUNTER — Other Ambulatory Visit: Payer: Self-pay | Admitting: Physician Assistant

## 2024-10-28 DIAGNOSIS — Z1231 Encounter for screening mammogram for malignant neoplasm of breast: Secondary | ICD-10-CM

## 2024-11-17 ENCOUNTER — Ambulatory Visit
Admission: RE | Admit: 2024-11-17 | Discharge: 2024-11-17 | Disposition: A | Discharge status: Home / Self Care | Source: Ambulatory Visit | Attending: Physician Assistant | Admitting: Physician Assistant

## 2024-11-17 DIAGNOSIS — Z1231 Encounter for screening mammogram for malignant neoplasm of breast: Secondary | ICD-10-CM

## 2024-12-12 NOTE — Progress Notes (Unsigned)
 12/14/2024 FATEMA RABE [id number] [birth year]  Referring provider: Toribio Jerel MATSU, MD Primary GI doctor: Dr. Federico (Dr. Aneita)  ASSESSMENT AND PLAN:  GERD/hiatal hernia/gastroparesis/cameron erosions 2022  With AB pain No NSAIDS, no ETOH 2011 GES delayed gastric emptying 07/20/2024 EGD Dr. Federico 7 cm [facility] localized inflammation characterized by congestion erythema nodularity gastric body nonbleeding diverticulum duodenal bulb normal esophagus.    Pathology negative H. pylori and metaplasia 07/26/2024 RUQ US  cholelithiasis fatty liver 08/17/2024 HIDA absence of gallbladder activity status post laparoscopic cholecystectomy 09/06/2024 with Dr. Rubin Voquezna  did not help she is on protonix 40 mg BID, pepcid  40 mg at night Treated with flagyl  for 2 weeks with significant difference in her symptoms, has only take reglan  twice, following gastroparesis diet rarely - consider KUB/optimizing bowel regimen, trial of motegrity - continue protonix 40 mg BID and pepcid  40 mg at night - continue gastroparesis diet - consider re treatment of flagyl  with neomycin - consider referral to general sugery for hiatal hernia repair   History of IDA thought secondary to [employer] with oncology last seen 01/13/2024 On oral iron and B12 with resolution 01/06/2024  HGB 14.3 MCV 91.1 Platelets 158 01/06/2024 Iron 68 Ferritin 69 B12 903 Recent Labs    01/06/24 1021 09/02/24 1126  HGB 14.3 14.1  02/26/2021 colonoscopy no source of bleeding recall 7 years 09/2021 EGD medium-size hiatal hernia Ole erosions likely source of chronic blood loss and IDA  B12 deficiency 01/06/2024  B12 903  Personal history of colon polyps 02/26/2021 colonoscopy adequate bowel prep 2 TA polyps 7 to 8 mm transverse, tics right colon tics left colon, recall 7 years  Obesity  Body mass index is 35.32 kg/m.  -Patient has been advised to make an attempt to improve diet and exercise patterns to aid in weight  loss. -Recommended diet heavy in fruits and veggies and low in animal meats, cheeses, and dairy products, appropriate calorie intake   Patient Care Team: Toribio Jerel MATSU, MD as PCP - General (Family Medicine)  HISTORY OF PRESENT ILLNESS: 73 y.o. female with a past medical history listed below presents for evaluation of GERD.   I last saw the patient in the office 10/07/2024 for reflux. 07/20/2024 EGD Dr. Federico 7 cm [facility] localized inflammation characterized by congestion erythema nodularity gastric body nonbleeding diverticulum duodenal bulb normal esophagus.  Pantoprazole twice daily reflux Gourmet consider Reglan .  Pathology negative H. pylori and metaplasia  Discussed the use of AI scribe software for clinical note transcription with the patient, who gave verbal consent to proceed.  History of Present Illness   AFIYA FERREBEE is a 73 year old female with gastroparesis who presents for follow-up after antibiotic treatment for gastrointestinal symptoms.  Completed a two-week course of antibiotics for episodes of abdominal swelling, rolling sensations, and audible noises, with complete resolution of symptoms and no recurrence since treatment. Used only two doses of Reglan  since the last visit.  Gastroparesis was confirmed by gastric emptying study in 2011. Follows gastroparesis diet to some extent and reports significant improvement in symptoms. Currently takes acid-suppressing medication and rarely experiences acid attacks.  Cholecystectomy performed approximately fifteen years ago, with initial difficulty adjusting eating habits postoperatively, but currently doing well.  Lifelong mild constipation managed with Senokot as needed; notes some improvement in constipation following antibiotic treatment. Continues to experience internal and external hemorrhoids, managed with over-the-counter treatments as needed.        She  reports that she has never smoked.  She has never used smokeless  tobacco. She reports that she does not drink alcohol and does not use drugs.  RELEVANT GI HISTORY, IMAGING AND LABS: Results   Diagnostic Gastric emptying study (2011): Delayed gastric emptying      CBC    Component Value Date/Time   WBC 6.2 09/02/2024 1126   RBC 4.87 09/02/2024 1126   HGB 14.1 09/02/2024 1126   HCT 45.0 09/02/2024 1126   PLT 172 09/02/2024 1126   MCV 92.4 09/02/2024 1126   MCH 29.0 09/02/2024 1126   MCHC 31.3 09/02/2024 1126   RDW 14.0 09/02/2024 1126   LYMPHSABS 1.1 01/06/2024 1021   MONOABS 0.4 01/06/2024 1021   EOSABS 0.0 01/06/2024 1021   BASOSABS 0.0 01/06/2024 1021   Recent Labs    01/06/24 1021 09/02/24 1126  HGB 14.3 14.1    CMP     Component Value Date/Time   NA 138 01/06/2024 1021   K 4.4 01/06/2024 1021   CL 103 01/06/2024 1021   CO2 29 01/06/2024 1021   GLUCOSE 111 (H) 01/06/2024 1021   BUN 15 01/06/2024 1021   CREATININE 0.92 01/06/2024 1021   CALCIUM 9.1 01/06/2024 1021   PROT 6.8 01/06/2024 1021   ALBUMIN 4.0 01/06/2024 1021   AST 16 01/06/2024 1021   ALT 14 01/06/2024 1021   ALKPHOS 66 01/06/2024 1021   BILITOT 0.9 01/06/2024 1021   GFRNONAA >60 01/06/2024 1021      Latest Ref Rng & Units 01/06/2024   10:21 AM 02/05/2017    9:38 AM  Hepatic Function  Total Protein 6.5 - 8.1 g/dL 6.8  6.8   Albumin 3.5 - 5.0 g/dL 4.0  4.2   AST 15 - 41 U/L 16  14   ALT 0 - 44 U/L 14  12   Alk Phosphatase 38 - 126 U/L 66  79   Total Bilirubin 0.0 - 1.2 mg/dL 0.9  0.7       Current Medications:   Current Outpatient Medications (Endocrine & Metabolic):    levothyroxine (SYNTHROID) 88 MCG tablet, Take 88 mcg by mouth in the morning.  Current Outpatient Medications (Respiratory):    fluticasone (FLONASE) 50 MCG/ACT nasal spray, Place 1 spray into both nostrils daily.  Current Outpatient Medications (Analgesics):    acetaminophen  (TYLENOL ) 500 MG tablet, Take 500 mg by mouth every 6 (six) hours as needed for moderate pain (pain score  4-6).  Current Outpatient Medications (Hematological):    Ferrous Sulfate (IRON) 325 (65 Fe) MG TABS, Take 325 mg by mouth daily.   vitamin B-12 (CYANOCOBALAMIN ) 1000 MCG tablet, Take 1,000 mcg by mouth daily.  Current Outpatient Medications (Other):    Alum Hydroxide-Mag Carbonate (GAVISCON PO), Take 1-2 tablets by mouth daily as needed (heartburn).   calcium carbonate (TUMS EX) 750 MG chewable tablet, Chew 1-2 tablets by mouth daily as needed (heartburn/indigestion).   cholecalciferol (VITAMIN D3) 25 MCG (1000 UNIT) tablet, Take 1,000 Units by mouth daily.   famotidine  (PEPCID ) 40 MG tablet, Take 1 tablet (40 mg total) by mouth at bedtime.   gabapentin (NEURONTIN) 300 MG capsule, Take 300-600 mg by mouth at bedtime.   metoCLOPramide  (REGLAN ) 5 MG tablet, Take 1 tablet (5 mg total) by mouth every 8 (eight) hours as needed for up to 20 days for nausea (AB pain).   pantoprazole (PROTONIX) 40 MG tablet, Take 40 mg by mouth 2 (two) times daily.   timolol (TIMOPTIC) 0.5 % ophthalmic solution, Place 1 drop into both eyes in  the morning.   zolpidem (AMBIEN) 10 MG tablet, Take 10 mg by mouth at bedtime as needed for sleep.  Medical History:  Past Medical History:  Diagnosis Date   Allergy    Anemia    Blood transfusion without reported diagnosis    for IDA   Complication of anesthesia    GERD (gastroesophageal reflux disease)    Glaucoma    Graves disease    Hiatal hernia    History of colon polyps    IDA (iron deficiency anemia)    PONV (postoperative nausea and vomiting)    Thyroid disease    Allergies:  Allergies  Allergen Reactions   Clindamycin/Lincomycin Other (See Comments)   Codeine Nausea And Vomiting     GI Upset   Dexlansoprazole Diarrhea   Esomeprazole Magnesium Diarrhea    nexium    Levofloxacin Other (See Comments)   Penicillins Rash   Rabeprazole Sodium Other (See Comments)    REACTION: Headaches   Valium  [Diazepam ] Other (See Comments)     Surgical History:   She  has a past surgical history that includes Thyroidectomy; Abdominal hysterectomy; Back surgery; ovaries removed (2010); Breast excisional biopsy (Right); Colonoscopy (2016); Cholecystectomy (N/A, 09/06/2024); and Vaginal hysterectomy (Bilateral). Family History:  Her family history includes Cancer in her brother; Cancer (age of onset: 85) in her brother; Colon cancer in her maternal grandmother; Rectal cancer (age of onset: 74) in her brother.  REVIEW OF SYSTEMS  : All other systems reviewed and negative except where noted in the History of Present Illness.  PHYSICAL EXAM: BP 136/64   Pulse 67   Ht 5' 7 (1.702 m)   Wt 225 lb 8 oz (102.3 kg)   SpO2 98%   BMI 35.32 kg/m  Physical Exam   GENERAL APPEARANCE: Well nourished, in no apparent distress. HEENT: No cervical lymphadenopathy, unremarkable thyroid, sclerae anicteric, conjunctiva pink. RESPIRATORY: Respiratory effort normal, breath sounds equal bilaterally without rales, rhonchi, or wheezing. CARDIO: Regular rate and rhythm with no murmurs, rubs, or gallops, peripheral pulses intact. ABDOMEN: Soft, non-distended, active bowel sounds in all four quadrants, non-tender to palpation, no rebound, no mass appreciated. RECTAL: Declines. MUSCULOSKELETAL: Full range of motion, normal gait, without edema. SKIN: Dry, intact without rashes or lesions. No jaundice. NEURO: Alert, oriented, no focal deficits. PSYCH: Cooperative, normal mood and affect.      Alan JONELLE Coombs, PA-C 12:12 PM

## 2024-12-14 ENCOUNTER — Ambulatory Visit: Admitting: Physician Assistant

## 2024-12-14 ENCOUNTER — Encounter: Payer: Self-pay | Admitting: Physician Assistant

## 2024-12-14 VITALS — BP 136/64 | HR 67 | Ht 67.0 in | Wt 225.5 lb

## 2024-12-14 DIAGNOSIS — Z860100 Personal history of colon polyps, unspecified: Secondary | ICD-10-CM

## 2024-12-14 DIAGNOSIS — K3184 Gastroparesis: Secondary | ICD-10-CM

## 2024-12-14 DIAGNOSIS — E669 Obesity, unspecified: Secondary | ICD-10-CM | POA: Diagnosis not present

## 2024-12-14 DIAGNOSIS — K8020 Calculus of gallbladder without cholecystitis without obstruction: Secondary | ICD-10-CM

## 2024-12-14 DIAGNOSIS — Z6835 Body mass index (BMI) 35.0-35.9, adult: Secondary | ICD-10-CM

## 2024-12-14 DIAGNOSIS — K449 Diaphragmatic hernia without obstruction or gangrene: Secondary | ICD-10-CM | POA: Diagnosis not present

## 2024-12-14 DIAGNOSIS — K219 Gastro-esophageal reflux disease without esophagitis: Secondary | ICD-10-CM | POA: Diagnosis not present

## 2024-12-14 DIAGNOSIS — D509 Iron deficiency anemia, unspecified: Secondary | ICD-10-CM

## 2024-12-14 NOTE — Patient Instructions (Addendum)
 _______________________________________________________  If your blood pressure at your visit was 140/90 or greater, please contact your primary care physician to follow up on this.  _______________________________________________________  If you are age 71 or older, your body mass index should be between 23-30. Your Body mass index is 35.32 kg/m. If this is out of the aforementioned range listed, please consider follow up with your Primary Care Provider.  If you are age 21 or younger, your body mass index should be between 19-25. Your Body mass index is 35.32 kg/m. If this is out of the aformentioned range listed, please consider follow up with your Primary Care Provider.   ________________________________________________________  The [facility] providers would like to encourage you to use MYCHART to communicate with providers for non-urgent requests or questions.  Due to long hold times on the telephone, sending your provider a message by [facility] may be a faster and more efficient way to get a response.  Please allow 48 business hours for a response.  Please remember that this is for non-urgent requests.  _______________________________________________________  [facility] is using a team-based approach to care.  Your team is made up of your doctor and two to three APPS. Our APPS (Nurse Practitioners and Physician Assistants) work with your physician to ensure care continuity for you. They are fully qualified to address your health concerns and develop a treatment plan. They communicate directly with your gastroenterologist to care for you. Seeing the Advanced Practice Practitioners on your physician's team can help you by facilitating care more promptly, often allowing for earlier appointments, access to diagnostic testing, procedures, and other specialty referrals.   Please follow up in 12 months. Give us  a call at [phone] to schedule an appointment.  Sometimes the degree  of constipation a person is experiencing can be underappreciated.  When individuals are constipated there can be considerable back pressure on the upper GI tract leading to nausea, bloating and fullness which may or may not be contributing to some of your symptoms.   There are a variety of different ways to manage this.  We can consider the use of over-the-counter laxatives such as Dulcolax, senna or MiraLAX.  Dulcolax and senna will increase colon motility to help expel the bowel movements whereas MiraLAX will simply soften the stool.  If you are stools are not excessively hard then it may be best to focus on senna or Dulcolax as options. We can also consider pelvic floor dysfunction as a contributing cause.   Toileting tips to help with your constipation - Drink at least 64-80 ounces of water/liquid per day. - Establish a time to try to move your bowels every day.  For many people, this is after a cup of coffee or after a meal such as breakfast. - Sit all of the way back on the toilet keeping your back fairly straight and while sitting up, try to rest the tops of your forearms on your upper thighs.   - Raising your feet with a step stool/squatty potty can be helpful to improve the angle that allows your stool to pass through the rectum. - Relax the rectum feeling it bulge toward the toilet water.  If you feel your rectum raising toward your body, you are contracting rather than relaxing. - Breathe in and slowly exhale. Belly breath by expanding your belly towards your belly button. Keep belly expanded as you gently direct pressure down and back to the anus.  A low pitched GRRR sound can assist with increasing intra-abdominal pressure.  (  Can also trying to blow on a pinwheel and make it move, this helps with the same belly breathing) - Repeat 3-4 times. If unsuccessful, contract the pelvic floor to restore normal tone and get off the toilet.  Avoid excessive straining. - To reduce excessive wiping by  teaching your anus to normally contract, place hands on outer aspect of knees and resist knee movement outward.  Hold 5-10 second then place hands just inside of knees and resist inward movement of knees.  Hold 5 seconds.  Repeat a few times each way.  Go to the ER if unable to pass gas, severe AB pain, unable to hold down food, any shortness of breath of chest pain.  Thank you for trusting me with your gastrointestinal care!

## 2025-01-04 ENCOUNTER — Inpatient Hospital Stay: Payer: Medicare Other | Attending: Physician Assistant

## 2025-01-04 DIAGNOSIS — D509 Iron deficiency anemia, unspecified: Secondary | ICD-10-CM

## 2025-01-04 DIAGNOSIS — E538 Deficiency of other specified B group vitamins: Secondary | ICD-10-CM

## 2025-01-04 LAB — FERRITIN: Ferritin: 107 ng/mL (ref 11–307)

## 2025-01-04 LAB — IRON AND TIBC
Iron: 49 ug/dL (ref 28–170)
Saturation Ratios: 15 % (ref 10.4–31.8)
TIBC: 322 ug/dL (ref 250–450)
UIBC: 273 ug/dL

## 2025-01-04 LAB — CBC WITH DIFFERENTIAL/PLATELET
Abs Immature Granulocytes: 0.01 K/uL (ref 0.00–0.07)
Basophils Absolute: 0 K/uL (ref 0.0–0.1)
Basophils Relative: 0 %
Eosinophils Absolute: 0 K/uL (ref 0.0–0.5)
Eosinophils Relative: 0 %
HCT: 42.3 % (ref 36.0–46.0)
Hemoglobin: 13.7 g/dL (ref 12.0–15.0)
Immature Granulocytes: 0 %
Lymphocytes Relative: 17 %
Lymphs Abs: 1.2 K/uL (ref 0.7–4.0)
MCH: 30 pg (ref 26.0–34.0)
MCHC: 32.4 g/dL (ref 30.0–36.0)
MCV: 92.6 fL (ref 80.0–100.0)
Monocytes Absolute: 0.4 K/uL (ref 0.1–1.0)
Monocytes Relative: 6 %
Neutro Abs: 5.2 K/uL (ref 1.7–7.7)
Neutrophils Relative %: 77 %
Platelets: 161 K/uL (ref 150–400)
RBC: 4.57 MIL/uL (ref 3.87–5.11)
RDW: 13.9 % (ref 11.5–15.5)
WBC: 6.8 K/uL (ref 4.0–10.5)
nRBC: 0 % (ref 0.0–0.2)

## 2025-01-04 LAB — VITAMIN B12: Vitamin B-12: 566 pg/mL (ref 180–914)

## 2025-01-06 LAB — METHYLMALONIC ACID, SERUM: Methylmalonic Acid, Quantitative: 116 nmol/L (ref 0–378)

## 2025-01-10 NOTE — Progress Notes (Unsigned)
 "  [facility] [address], [place] [address]   CLINIC:  Medical Oncology/Hematology  PCP:  Toribio Jerel MATSU, MD [address] [place] [address] [phone]   REASON FOR VISIT:  Follow-up for iron deficiency anemia   PRIOR THERAPY: IV iron as needed (last given September 2022), PRBC transfusion (September 2022)   CURRENT THERAPY: Oral iron tablet  INTERVAL HISTORY:   Shannon Figueroa 74 y.o. female returns for routine follow-up of her iron deficiency anemia.   She was last seen by Pleasant Barefoot PA-C on 01/13/2024.   At today's visit, she reports feeling fairly well.   She had cholecystectomy in September 2025, but no recent hospitalizations, surgeries, or changes in baseline health status.   She has 100% energy and 100% appetite.  She endorses that she is maintaining a stable weight.   She never noticed any obvious bleeding such as hematochezia, melena, or hematemesis.   She does report that her stool has been dark ever since she was started on iron pill.   Previously reported pica resolved after starting iron supplement.    She denies any fatigue, headaches, lightheadedness, syncope.   She does have some restless leg symptoms that have been ongoing for some time, relieved with gabapentin.   She denies any chest pain or dyspnea on exertion.    She is taking B12 tablet and iron tablet EOD.   ASSESSMENT & PLAN:  1.  Severe iron deficiency anemia: - Reported fatigue, shortness of breath and dizziness for several months, found to have Hgb 5.5, ferritin 3, MCV 65 (08/26/2021). - Received 1 unit PRBC and 2 infusions of IV Feraheme in September 2022 - Colonoscopy (02/26/2021): Diverticulosis and polyps - EGD (10/09/2021): Gastric erosions and hiatal hernia, without active bleeding.  Per GI note, these Ole lesions were felt to be the source of chronic blood loss leading to iron deficiency anemia. - Most recent EGD (07/20/2024) with gastritis and with nonbleeding  duodenal diverticulum.  No active bleeding. - SPEP negative - She reports to be on antacids for the past several years for hiatal hernia and acid reflux. - She follows with [facility] (Dr. Aneita, Alan Coombs PA-C) - She has been taking 3x weekly iron tablet since November 2022. - She has not noticed any bright red blood per rectum or melena - Symptoms of pica, fatigue, DOE, chest pain, and lightheadedness have resolved - Most recent labs (01/04/2025): Hgb 13.7/MCV 92.6, platelets 161.  Ferritin 107, iron saturation 15%. - PLAN: Blood and iron levels at goal.  No indication for IV iron at this time. - Continue taking oral iron tablet EOD to maintain ferritin 100-200.  Iron levels can be monitored and adjusted by PCP. - She has not required IV iron in the past 3 years and is stable on oral iron supplementation.  Recommend discharge to PCP and GI.  Patient can return on an as-needed basis in the future.     2.  Vitamin B12 deficiency: - B12 level (10/04/2021) was low at 171 with normal methylmalonic acid.  Copper  and folic acid  were normal. - She received B12 injection in November 2022. - She has been taking vitamin B12 1000 mcg tab EOD since November 2022. - Most recent labs (01/04/2025): Normal vitamin B12 566, normal MMA - PLAN: Continue vitamin B12 supplement EOD.  Charge to PCP.   3.  Mild intermittent thrombocytopenia - Mild intermittent thrombocytopenia starting in June 2023.  Platelets have remained >140 - Scan  of ultrasound performed at [facility] on 01/23/2017 showed fatty infiltration of liver, but normal-sized spleen. - Hematology workup (01/06/2024): Normal immature platelet fraction 5.9%.  CMP normal.  Normal copper , B12, MMA, folate. - Most recent CBC (01/04/2025): Platelets normal at 161. - PLAN: No additional workup at this time.  Discharge to PCP. - She can return on an as needed basis.  If any significant recurrent thrombocytopenia in the future, would consider  checking repeat abdominal imaging.    4.  Family/social history: - Lives at home with her husband.  She is retired from comptroller.  Non-smoker.  No family history of anemia.  Maternal grandmother died of metastatic cancer.  Brother died of anal cancer.   PLAN SUMMARY: >> Discharge to PCP     REVIEW OF SYSTEMS:  Review of Systems  Constitutional:  Negative for appetite change, chills, diaphoresis, fatigue, fever and unexpected weight change.  HENT:   Negative for lump/mass and nosebleeds.   Eyes:  Negative for eye problems.  Respiratory:  Negative for cough, hemoptysis and shortness of breath.   Cardiovascular:  Negative for chest pain, leg swelling and palpitations.  Gastrointestinal:  Positive for constipation. Negative for abdominal pain, blood in stool, diarrhea, nausea and vomiting.  Genitourinary:  Negative for hematuria.   Skin: Negative.   Neurological:  Positive for numbness. Negative for dizziness, headaches and light-headedness.  Hematological:  Does not bruise/bleed easily.     PHYSICAL EXAM:  ECOG PERFORMANCE STATUS: 0 - Asymptomatic  Vitals:   01/11/25 0956  BP: 134/77  Pulse: 69  Resp: 18  Temp: 97.9 F (36.6 C)  SpO2: 100%    Filed Weights   01/11/25 0956  Weight: 224 lb 6.9 oz (101.8 kg)    Physical Exam Constitutional:      Appearance: Normal appearance. She is obese.  Cardiovascular:     Heart sounds: Normal heart sounds.  Pulmonary:     Breath sounds: Normal breath sounds.  Neurological:     General: No focal deficit present.     Mental Status: Mental status is at baseline.  Psychiatric:        Behavior: Behavior normal. Behavior is cooperative.     PAST MEDICAL/SURGICAL HISTORY:  Past Medical History:  Diagnosis Date   Allergy    Anemia    Blood transfusion without reported diagnosis    for IDA   Complication of anesthesia    GERD (gastroesophageal reflux disease)    Glaucoma    Graves disease    Hiatal hernia    History  of colon polyps    IDA (iron deficiency anemia)    PONV (postoperative nausea and vomiting)    Thyroid disease    Past Surgical History:  Procedure Laterality Date   ABDOMINAL HYSTERECTOMY     BACK SURGERY     BREAST EXCISIONAL BIOPSY Right    benign   CHOLECYSTECTOMY N/A 09/06/2024   Procedure: LAPAROSCOPIC CHOLECYSTECTOMY;  Surgeon: Rubin Calamity, MD;  Location: [facility] OR;  Service: General;  Laterality: N/A;   COLONOSCOPY  2016   ovaries removed  2010   THYROIDECTOMY     VAGINAL HYSTERECTOMY Bilateral     SOCIAL HISTORY:  Social History   Socioeconomic History   Marital status: Married    Spouse name: Not on file   Number of children: Not on file   Years of education: Not on file   Highest education level: Not on file  Occupational History   Not on file  Tobacco Use  Smoking status: Never   Smokeless tobacco: Never  Vaping Use   Vaping status: Never Used  Substance and Sexual Activity   Alcohol use: No    Alcohol/week: 0.0 standard drinks of alcohol   Drug use: No   Sexual activity: Not Currently  Other Topics Concern   Not on file  Social History Narrative   Not on file   Social Drivers of Health   Tobacco Use: Low Risk (12/14/2024)   Patient History    Smoking Tobacco Use: Never    Smokeless Tobacco Use: Never    Passive Exposure: Not on file  Financial Resource Strain: Not on file  Food Insecurity: Unknown (01/31/2022)   Received from [facility]   Epic    Within the past 12 months, you worried that your food would run out before you got the money to buy more.: Patient declined    Within the past 12 months, the food you bought just didn't last and you didn't have money to get more.: Patient declined  Transportation Needs: Unknown (01/31/2022)   Received from [facility] - Transportation    Lack of Transportation (Medical): Patient declined    Lack of Transportation (Non-Medical): Patient declined  Physical Activity: Unknown (01/31/2022)    Received from [facility]   Exercise Vital Sign    On average, how many days per week do you engage in moderate to strenuous exercise (like a brisk walk)?: Patient declined    Minutes of Exercise per Session: Not on file  Stress: Unknown (01/31/2022)   Received from [facility] - Occupational Stress Questionnaire    Feeling of Stress : Patient declined  Social Connections: Unknown (05/13/2022)   Received from [facility]   Social Network    Social Network: Not on file  Intimate Partner Violence: Unknown (04/04/2022)   Received from [facility]   HITS    Physically Hurt: Not on file    Insult or Talk Down To: Not on file    Threaten Physical Harm: Not on file    Scream or Curse: Not on file  Depression (PHQ2-9): Low Risk (01/11/2025)   Depression (PHQ2-9)    PHQ-2 Score: 0  Alcohol Screen: Not on file  Housing: Unknown (08/19/2024)   Received from [facility]   Epic    Unable to Pay for Housing in the Last Year: Not on file    Number of Times Moved in the Last Year: Not on file    At any time in the past 12 months, were you homeless or living in a shelter (including now)?: No  Utilities: Not on file  Health Literacy: Not on file    FAMILY HISTORY:  Family History  Problem Relation Age of Onset   Cancer Brother 74       anal cancer-   Rectal cancer Brother 88       started as anal cancer   Cancer Brother    Colon cancer Maternal Grandmother        unsure if styarted in colon- had metastasized all over her    Esophageal cancer Neg Hx    Colon polyps Neg Hx    Stomach cancer Neg Hx     CURRENT MEDICATIONS:  Outpatient Encounter Medications as of 01/11/2025  Medication Sig   acetaminophen  (TYLENOL ) 500 MG tablet Take 500 mg by mouth every 6 (six) hours as needed for moderate pain (pain score 4-6).  Alum Hydroxide-Mag Carbonate (GAVISCON PO) Take 1-2 tablets by mouth daily as needed (heartburn).   calcium  carbonate (TUMS EX) 750 MG chewable tablet Chew 1-2 tablets by mouth daily as needed (heartburn/indigestion).   cholecalciferol (VITAMIN D3) 25 MCG (1000 UNIT) tablet Take 1,000 Units by mouth daily.   famotidine  (PEPCID ) 40 MG tablet Take 1 tablet (40 mg total) by mouth at bedtime.   Ferrous Sulfate (IRON) 325 (65 Fe) MG TABS Take 325 mg by mouth daily.   fluticasone (FLONASE) 50 MCG/ACT nasal spray Place 1 spray into both nostrils daily.   gabapentin (NEURONTIN) 300 MG capsule Take 300-600 mg by mouth at bedtime.   levothyroxine (SYNTHROID) 88 MCG tablet Take 88 mcg by mouth in the morning.   metoCLOPramide  (REGLAN ) 5 MG tablet Take 1 tablet (5 mg total) by mouth every 8 (eight) hours as needed for up to 20 days for nausea (AB pain).   pantoprazole (PROTONIX) 40 MG tablet Take 40 mg by mouth 2 (two) times daily.   timolol (TIMOPTIC) 0.5 % ophthalmic solution Place 1 drop into both eyes in the morning.   vitamin B-12 (CYANOCOBALAMIN ) 1000 MCG tablet Take 1,000 mcg by mouth daily.   zolpidem (AMBIEN) 10 MG tablet Take 10 mg by mouth at bedtime as needed for sleep.   No facility-administered encounter medications on file as of 01/11/2025.    ALLERGIES:  Allergies  Allergen Reactions   Tramadol  Nausea Only   Clindamycin/Lincomycin Other (See Comments)   Codeine Nausea And Vomiting     GI Upset   Dexlansoprazole Diarrhea   Esomeprazole Magnesium Diarrhea    nexium    Levofloxacin Other (See Comments)   Penicillins Rash   Rabeprazole Sodium Other (See Comments)    REACTION: Headaches   Valium  [Diazepam ] Other (See Comments)    LABORATORY DATA:  I have reviewed the labs as listed.  CBC    Component Value Date/Time   WBC 6.8 01/04/2025 0928   RBC 4.57 01/04/2025 0928   HGB 13.7 01/04/2025 0928   HCT 42.3 01/04/2025 0928   PLT 161 01/04/2025 0928   MCV 92.6 01/04/2025 0928   MCH 30.0 01/04/2025 0928   MCHC 32.4 01/04/2025 0928   RDW 13.9 01/04/2025 0928   LYMPHSABS 1.2  01/04/2025 0928   MONOABS 0.4 01/04/2025 0928   EOSABS 0.0 01/04/2025 0928   BASOSABS 0.0 01/04/2025 0928      Latest Ref Rng & Units 01/06/2024   10:21 AM 02/05/2017    9:38 AM  CMP  Glucose 70 - 99 mg/dL 888  862   BUN 8 - 23 mg/dL 15  11   Creatinine 9.55 - 1.00 mg/dL 9.07  8.97   Sodium 864 - 145 mmol/L 138  142   Potassium 3.5 - 5.1 mmol/L 4.4  3.9   Chloride 98 - 111 mmol/L 103  106   CO2 22 - 32 mmol/L 29  29   Calcium 8.9 - 10.3 mg/dL 9.1  9.3   Total Protein 6.5 - 8.1 g/dL 6.8  6.8   Total Bilirubin 0.0 - 1.2 mg/dL 0.9  0.7   Alkaline Phos 38 - 126 U/L 66  79   AST 15 - 41 U/L 16  14   ALT 0 - 44 U/L 14  12     DIAGNOSTIC IMAGING:  I have independently reviewed the relevant imaging and discussed with the patient.   WRAP UP:  All questions were answered. The patient knows to call the clinic with any  problems, questions or concerns.  Medical decision making: Low  Time spent on visit: I spent 15 minutes counseling the patient face to face. The total time spent in the appointment was 22 minutes and more than 50% was on counseling.  Pleasant CHRISTELLA Barefoot, PA-C  01/11/2025 10:45 AM  "

## 2025-01-11 ENCOUNTER — Inpatient Hospital Stay (HOSPITAL_BASED_OUTPATIENT_CLINIC_OR_DEPARTMENT_OTHER): Payer: Medicare Other | Admitting: Physician Assistant

## 2025-01-11 VITALS — BP 134/77 | HR 69 | Temp 97.9°F | Resp 18 | Wt 224.4 lb

## 2025-01-11 DIAGNOSIS — E538 Deficiency of other specified B group vitamins: Secondary | ICD-10-CM

## 2025-01-11 DIAGNOSIS — D696 Thrombocytopenia, unspecified: Secondary | ICD-10-CM

## 2025-01-11 DIAGNOSIS — D509 Iron deficiency anemia, unspecified: Secondary | ICD-10-CM

## 2025-01-11 NOTE — Patient Instructions (Signed)
 [facility] at [facility] Discharge Instructions  You were seen today by Pleasant Barefoot PA-C for your iron deficiency anemia.  Your blood and iron levels looked great!  Your anemia has resolved (your hemoglobin is normal).  At this time, you are stable to discharge to your primary care provider.  Your primary care provider should check your blood count (CBC) every 6 months, due to your history of GI blood loss.  Continue to take iron tablet 3 days weekly. Your primary care provider can check your iron levels once a year, with a goal of keeping your ferritin between 100-200.  Continue taking vitamin B12 tablet 3 days weekly. Your primary care provider can check your B12 levels once a year, with a goal of keeping your vitamin B12 between 400-900.  You do not need any follow-up appointments with us  at this time, but if you have any recurrent issues in the future, you can be referred back to us  at your primary care provider's discretion.   ** Thank you for trusting me with your healthcare!  I strive to provide all of my patients with quality care at each visit.  If you receive a survey for this visit, I would be so grateful to you for taking the time to provide feedback.  Thank you in advance!  ~ Marirose Deveney                                        Dr. Mickiel Davonna Pleasant Barefoot, PA-C          Delon Hope, NP   - - - - - - - - - - - - - - - - - -     Thank you for choosing [facility] at [facility] to provide your oncology and hematology care.  To afford each patient quality time with our provider, please arrive at least 15 minutes before your scheduled appointment time.   If you have a lab appointment with the Cancer Center please come in thru the Main Entrance and check in at the main information desk.  You need to re-schedule your appointment should you arrive 10 or more minutes late.  We strive to give you quality time with  our providers, and arriving late affects you and other patients whose appointments are after yours.  Also, if you no show three or more times for appointments you may be dismissed from the clinic at the providers discretion.     Again, thank you for choosing [facility].  Our hope is that these requests will decrease the amount of time that you wait before being seen by our physicians.       _____________________________________________________________  Should you have questions after your visit to [facility], please contact our office at [phone] and follow the prompts.  Our office hours are 8:00 a.m. and 4:30 p.m. Monday - Friday.  Please note that voicemails left after 4:00 p.m. may not be returned until the following business day.  We are closed weekends and major holidays.  You do have access to a nurse 24-7, just call the main number to the clinic [phone] and do not press any options, hold on the line and a nurse will answer the phone.    For prescription refill requests, have your pharmacy contact  our office and allow 72 hours.    Due to Covid, you will need to wear a mask upon entering the hospital. If you do not have a mask, a mask will be given to you at the Main Entrance upon arrival. For doctor visits, patients may have 1 support person age 56 or older with them. For treatment visits, patients can not have anyone with them due to social distancing guidelines and our immunocompromised population.
